# Patient Record
Sex: Male | Born: 1935 | Race: White | Hispanic: No | Smoking: Former smoker
Health system: Southern US, Community
[De-identification: ages and names within clinical notes are randomized; demographics above are authoritative.]

## PROBLEM LIST (undated history)

## (undated) DIAGNOSIS — D649 Anemia, unspecified: Secondary | ICD-10-CM

## (undated) DIAGNOSIS — W19XXXA Unspecified fall, initial encounter: Secondary | ICD-10-CM

## (undated) DIAGNOSIS — R296 Repeated falls: Secondary | ICD-10-CM

## (undated) DIAGNOSIS — N183 Chronic kidney disease, stage 3 unspecified: Secondary | ICD-10-CM

## (undated) DIAGNOSIS — S065X9A Traumatic subdural hemorrhage with loss of consciousness of unspecified duration, initial encounter: Secondary | ICD-10-CM

## (undated) DIAGNOSIS — F0390 Unspecified dementia without behavioral disturbance: Secondary | ICD-10-CM

## (undated) DIAGNOSIS — I10 Essential (primary) hypertension: Secondary | ICD-10-CM

## (undated) DIAGNOSIS — S065XAA Traumatic subdural hemorrhage with loss of consciousness status unknown, initial encounter: Secondary | ICD-10-CM

## (undated) HISTORY — PX: HAND SURGERY: SHX662

## (undated) HISTORY — PX: COLON SURGERY: SHX602

---

## 1998-07-13 ENCOUNTER — Encounter: Payer: Self-pay | Admitting: Orthopedic Surgery

## 1998-07-13 ENCOUNTER — Inpatient Hospital Stay (HOSPITAL_COMMUNITY): Admission: EM | Admit: 1998-07-13 | Discharge: 1998-07-19 | Payer: Self-pay | Admitting: Emergency Medicine

## 1998-07-16 ENCOUNTER — Encounter: Payer: Self-pay | Admitting: Orthopedic Surgery

## 1998-08-04 ENCOUNTER — Ambulatory Visit (HOSPITAL_COMMUNITY): Admission: RE | Admit: 1998-08-04 | Discharge: 1998-08-04 | Payer: Self-pay | Admitting: Orthopedic Surgery

## 1998-08-04 ENCOUNTER — Encounter: Payer: Self-pay | Admitting: Orthopedic Surgery

## 2000-09-28 ENCOUNTER — Other Ambulatory Visit: Admission: RE | Admit: 2000-09-28 | Discharge: 2000-09-28 | Payer: Self-pay | Admitting: Internal Medicine

## 2005-12-27 ENCOUNTER — Ambulatory Visit (HOSPITAL_COMMUNITY): Admission: RE | Admit: 2005-12-27 | Discharge: 2005-12-27 | Payer: Self-pay | Admitting: Pulmonary Disease

## 2006-03-10 ENCOUNTER — Emergency Department (HOSPITAL_COMMUNITY): Admission: EM | Admit: 2006-03-10 | Discharge: 2006-03-10 | Payer: Self-pay | Admitting: Emergency Medicine

## 2006-05-10 ENCOUNTER — Ambulatory Visit: Payer: Self-pay | Admitting: Gastroenterology

## 2006-05-10 ENCOUNTER — Encounter (INDEPENDENT_AMBULATORY_CARE_PROVIDER_SITE_OTHER): Payer: Self-pay | Admitting: *Deleted

## 2006-05-10 ENCOUNTER — Ambulatory Visit (HOSPITAL_COMMUNITY): Admission: RE | Admit: 2006-05-10 | Discharge: 2006-05-10 | Payer: Self-pay | Admitting: Gastroenterology

## 2006-07-14 ENCOUNTER — Encounter (INDEPENDENT_AMBULATORY_CARE_PROVIDER_SITE_OTHER): Payer: Self-pay | Admitting: Surgery

## 2006-07-14 ENCOUNTER — Inpatient Hospital Stay (HOSPITAL_COMMUNITY): Admission: RE | Admit: 2006-07-14 | Discharge: 2006-07-21 | Payer: Self-pay | Admitting: Surgery

## 2007-08-14 ENCOUNTER — Emergency Department (HOSPITAL_COMMUNITY): Admission: EM | Admit: 2007-08-14 | Discharge: 2007-08-14 | Payer: Self-pay | Admitting: Emergency Medicine

## 2009-03-06 ENCOUNTER — Observation Stay (HOSPITAL_COMMUNITY): Admission: EM | Admit: 2009-03-06 | Discharge: 2009-03-09 | Payer: Self-pay | Admitting: Emergency Medicine

## 2009-03-06 ENCOUNTER — Ambulatory Visit: Payer: Self-pay | Admitting: Cardiology

## 2009-03-06 ENCOUNTER — Encounter (INDEPENDENT_AMBULATORY_CARE_PROVIDER_SITE_OTHER): Payer: Self-pay | Admitting: Pulmonary Disease

## 2010-03-12 ENCOUNTER — Ambulatory Visit (HOSPITAL_COMMUNITY)
Admission: RE | Admit: 2010-03-12 | Discharge: 2010-03-12 | Disposition: A | Discharge status: Home / Self Care | Payer: Self-pay | Source: Ambulatory Visit | Attending: Pulmonary Disease | Admitting: Pulmonary Disease

## 2010-03-12 ENCOUNTER — Other Ambulatory Visit (HOSPITAL_COMMUNITY): Payer: Self-pay | Admitting: Pulmonary Disease

## 2010-03-12 DIAGNOSIS — M25559 Pain in unspecified hip: Secondary | ICD-10-CM | POA: Insufficient documentation

## 2010-03-12 DIAGNOSIS — M25551 Pain in right hip: Secondary | ICD-10-CM

## 2010-04-08 LAB — DIFFERENTIAL
Basophils Absolute: 0 10*3/uL (ref 0.0–0.1)
Eosinophils Absolute: 0.1 10*3/uL (ref 0.0–0.7)
Monocytes Absolute: 0.3 10*3/uL (ref 0.1–1.0)
Neutro Abs: 3.8 10*3/uL (ref 1.7–7.7)

## 2010-04-08 LAB — POCT CARDIAC MARKERS
CKMB, poc: <1 ng/mL — ABNORMAL LOW (ref 1.0–8.0)
Myoglobin, poc: 45.7 ng/mL (ref 12–200)

## 2010-04-08 LAB — CBC
Hemoglobin: 15.1 g/dL (ref 13.0–17.0)
RBC: 4.6 MIL/uL (ref 4.22–5.81)
RDW: 13.4 % (ref 11.5–15.5)
WBC: 5.6 10*3/uL (ref 4.0–10.5)

## 2010-04-08 LAB — CARDIAC PANEL(CRET KIN+CKTOT+MB+TROPI)
CK, MB: 2.1 ng/mL (ref 0.3–4.0)
Total CK: 43 U/L (ref 7–232)
Troponin I: 0.01 ng/mL (ref 0.00–0.06)

## 2010-04-08 LAB — BASIC METABOLIC PANEL: BUN: 14 mg/dL (ref 6–23)

## 2010-04-08 LAB — VITAMIN B12: Vitamin B-12: 1189 pg/mL — ABNORMAL HIGH (ref 211–911)

## 2010-04-08 LAB — HOMOCYSTEINE: Homocysteine: 7 umol/L (ref 4.0–15.4)

## 2010-04-08 LAB — TSH: TSH: 1.614 u[IU]/mL (ref 0.350–4.500)

## 2010-06-01 NOTE — Op Note (Signed)
NAMEJAMIR, RONE NO.:  [id number]   MEDICAL RECORD NO.:  [id number]          PATIENT TYPE:  INP   LOCATION:  2899                         FACILITY:  [facility]   PHYSICIAN:  Sandria Bales. Ezzard Standing, M.D.  DATE OF BIRTH:  08/02/1930   DATE OF PROCEDURE:  07/14/2006  DATE OF DISCHARGE:                               OPERATIVE REPORT   PREOPERATIVE DIAGNOSIS:  Right colonic tubovillous adenoma.   POSTOPERATIVE DIAGNOSIS:  Right colonic/cecal tubovillous adenoma.   PROCEDURE:  Laparoscope assisted right hemicolectomy.   SURGEON:  Sandria Bales. Ezzard Standing, M.D.   ASSISTANT:  Angelia Mould. Derrell Lolling, M.D.   ANESTHESIA:  General endotracheal.   ESTIMATED BLOOD LOSS:  100 mL.   DRAINS:  None.   INDICATIONS FOR PROCEDURE:  Mr. Gadson is a 77 year old male who underwent  a screening colonoscopy by Dr. Kassie Mends, was found to have a large  tubovillous adenoma of the cecum with no evidence of malignancy on  biopsy.   Patient now comes for attempted right hemicolectomy.  He completed  antibiotic and mechanical bowel prep at home and now comes to the  operating room.   The indications and potential complications of procedure were explained  to the patient.  The potential complications included but are not  limited to bleeding, infection, leak from the bowel and if it was  cancer, some possible recurrence of the cancer.   OPERATIVE NOTE:  The patient was placed in the supine position, given a  general intravenous anesthetic.  He has 1 gram of cefoxitin initiated at  the procedure, had PS stockings placed, Foley catheter in place.  His  arms were taped by his side.  He had his abdomen shaved, prepped with  Betadine solution, and sterilely draped.   An orogastric tube was placed.  I went through an infraumbilical  incision with sharp dissection, carried down to the abdominal cavity.  A  0 degree 10-mm laparoscope was inserted through a 12-mm Hasson.  I  placed four additional 5-mm  trocars in right upper quadrant, left upper  quadrant, right lower quadrant, left lower quadrant trocars.   Abdominal exploration revealed the right and left lobes of the liver  were unremarkable.  The gallbladder that I could see was unremarkable,  though it had some adhesions to it.  The liver was unremarkable.  I  first started with dissection.  The terminal ileum actually was adhesed  down over the pelvic brim, so I spent about 30 minutes releasing these  adhesions of the terminal ileum up to the cecum.  I then mobilized the  cecum, was able to grab the appendix and marched up the right colon, was  able to reflect this medial and guide into the proper fat plane.   I did not identify ureter, however, I thought I stayed well anterior to  the ureter during the dissection.   I then got to the lesser sac and the transverse colon, went over the  hepatic flexure taking down adhesions from the gallbladder, taking down  the hepatic flexure off the undersurface of the liver.  I was able to  identify the duodenum and sweep the right colon down below the duodenum.   At this time, I thought I had adequate mobilization above the terminal  ileum and the transverse colon and I made an abdominal wall incision  around the umbilicus.  I used the [employer] medical port to protect the  wound.  I was able to eviscerate his right colon.  I divided the  terminal ileum approximately 3 or 4 cm proximal to the ileocecal valve  with a GIA 75 Ethicon stapler.  I divided the right transverse colon  with a GIA 75 Ethicon stapler and then resected the bowel which was  maybe about 15-18 cm in length.  I divided the mesentery using 2-0 silk  ties.   I opened the bowel at the bedside and identified a large villous tumor  in the cecum/right colon.   I divided the bowel using the 75 mm Ethicon GIA stapler.  I then did a  side-to-side stapled anastomosis between the right transverse colon and  the terminal ileum.   Again, this was with a 75 GIA stapler.  I created  about a 6-cm anastomosis.  I closed the enterotomy defect with  interrupted 3-0 silk sutures.  I buttressed the end of the staple line  with 3-0 silk sutures.  I put some in the crotch with 3-0 silk sutures  and oversewed the staple line.   The staple line I examined before closing it.  It looked healthy.  There  was no bleeding.  His prep, however, looked somewhat poor.  I thought it  had essentially no stool contamination.   After completing the closure, I then irrigated the wound.  I tried to  look at closing the mesentery and really thought I would be unable to  close the mesentery completely.  I was afraid about leaving a small hole  so I did not try to close the mesentery.  I just returned the transverse  colon to the right upper quadrant of the abdomen.   I irrigated with 2 liters of saline until it was somewhat clear.  I then  reinsufflated the abdomen and looked through the 5-mm scope, saw no  other evidence of active bleeding.  I closed the midline incision with  two running #1 PDS sutures.  Closed the skin at each site with a staple  gun.  The patient tolerated the procedure well.  Sponge and needle  counts were correct at the end of the case.      Sandria Bales. Ezzard Standing, M.D.  Electronically Signed     DHN/MEDQ  D:  07/14/2006  T:  07/14/2006  Job:  [id number]   cc:   Ramon Dredge L. Juanetta Gosling, M.D.  Kassie Mends, M.D.

## 2010-06-04 NOTE — Discharge Summary (Signed)
NAMESUNIL, [name]                  ACCOUNT NO.:  [id number]   MEDICAL RECORD NO.:  [id number]          PATIENT TYPE:  INP   LOCATION:  5707                         FACILITY:  [facility]   PHYSICIAN:  Sandria Bales. Ezzard Standing, M.D.  DATE OF BIRTH:  09/01/1930   DATE OF ADMISSION:  07/14/2006  DATE OF DISCHARGE:  07/21/2006                               DISCHARGE SUMMARY   DISCHARGE DIAGNOSES:  1. Tubovillous adenoma of right colon, 5 x 4 centimeters, 0-13 lymph      nodes involved.  2. Postoperative delirium/confusion, which is cleared.  3. Mild thrombocytopenia during hospitalization.   OPERATIONS PERFORMED:  The patient underwent a laparoscopic assisted  right hemicolectomy on the 27th of June 2008.   HISTORY OF PRESENT ILLNESS:  [name] is a pleasant, though somewhat  odd, 77 year old white male who is a patient of Dr. Juanetta Gosling of  [place].  He underwent a screening colonoscopy by Dr. Kassie Mends,  and was found to have a large tumor of his cecum.  He was interested in  taking vitamins and colonic cleansing to try to remove his polyp.  I  thought  he would best served by undergoing surgery.  IN my office, he  was accompanied by 3 daughters, who agreed that surgery would be  superior than trying a colonic flush to remove his polyp.   He is actually otherwise in fairly good health.  He does have maybe some  mild early dementia or senility, but he has no significant medical  problems.   He completed an antibiotic and mechanical bowel prep at home.  Came to  the hospital on the day of admission, the 27th of June 2008.  He  underwent a laparoscopically assisted right hemicolectomy, for which he  did very well.   On the first postoperative day, his hemoglobin was 14, hematocrit 41,  white count of 10,000.  His platelets dropped a little bit to 137,000.  They were noted to be 119,000 preoperatively on July 16, 2006.   He did become mildly confused in the hospital about the third or fourth  postoperative day, but this seemed better by the fifth postoperative  day.  He was started on clear liquids, and rapidly advanced to a regular  diet.  By the seventh postoperative day, he was eating a regular diet.  He was afebrile.  He had a bowel movement.  His wounds were looking  good, his staples were removed.  He was sent home on Darvocet-N 100, and  would see me back in 3-4 weeks.   His final pathology, case number [id number], and was read by Dr. Berneta Levins, showed a 5.3 x 4 cm cecal/right colon tubovillous adenoma with no  evidence of malignancy, and 0-13 nodes noted with any abnormality.   CONDITION ON DISCHARGE:  Good.      Sandria Bales. Ezzard Standing, M.D.  Electronically Signed     DHN/MEDQ  D:  08/27/2006  T:  08/27/2006  Job:  [id number]   cc:   Kassie Mends, M.D.  Aliene Beams, MD

## 2010-06-04 NOTE — Op Note (Signed)
Blake Howard, Blake Howard                  ACCOUNT NO.:  [id number]   MEDICAL RECORD NO.:  [id number]          PATIENT TYPE:  AMB   LOCATION:  DAY                           FACILITY:  [facility]   PHYSICIAN:  Kassie Mends, M.D.      DATE OF BIRTH:  [birth year]   DATE OF PROCEDURE:  05/10/2006  DATE OF DISCHARGE:  05/11/2006                               OPERATIVE REPORT   PROCEDURE:  Colonoscopy with cold forceps biopsies.   INDICATION FOR EXAM:  Blake Howard is a 77 year old male who presents for  average risk colon cancer screening.   FINDINGS:  1. Large flat cecal polyp which occupies approximately 2/3rds of the      cecum and extends to the ileocecal border and ascending colon.  It      spares the ileocecal valve.  Multiple biopsies taken via Jumbo      forceps.  2. The patient's prep was poor, especially in his right colon and      polyps less than 1 cm would have been easily missed.  Otherwise no      masses or inflammatory changes seen.  3. Normal retroflexed view of the rectum.   RECOMMENDATIONS:  1. The patient's biopsies were sent as a priority.  I believe he has a      large villous cecal polyp which may contain a small neoplasia.  2. He is asked not to use any aspirin, NSAIDs or anticoagulation until      I let him know.  3. He is given information on polyps.  4. He will likely need at a minimum a cecotomy.  He is asked that his      surgery be performed in [place].  5. Screening colonoscopy in 5 years.   MEDICATIONS:  1. Demerol 25 mg IV.  2. Versed 2 mg IV   PROCEDURE TECHNIQUE:  Physical exam was performed.  Informed consent was  obtained from the patient after explaining benefits, risks and  alternatives to the procedure.  The patient was connected to the monitor  and placed in left lateral position.  Continuous oxygen was provided by  nasal cannula and IV medicine administered through an indwelling  cannula.  After administration of sedation and rectal exam, the  patient's rectum was intubated and the  scope was advanced under direct visualization to the cecum.  The scope  was subsequently moved slowly by carefully examining the color, texture,  anatomy and integrity of the mucosa on the way out.  The patient was  covered in endoscopy and discharged home in satisfactory condition.      Kassie Mends, M.D.  Electronically Signed     SM/MEDQ  D:  05/11/2006  T:  05/11/2006  Job:  [id number]   cc:   Ramon Dredge L. Juanetta Gosling, M.D.  Fax: [phone]

## 2010-11-02 LAB — DIFFERENTIAL
Basophils Relative: 0
Lymphocytes Relative: 4 — ABNORMAL LOW
Monocytes Absolute: 0.6
Neutro Abs: 7.5

## 2010-11-02 LAB — BASIC METABOLIC PANEL
BUN: 13
CO2: 28
Calcium: 8.2 — ABNORMAL LOW
Chloride: 105
Creatinine, Ser: 0.9
GFR calc non Af Amer: >60
Potassium: 4

## 2010-11-02 LAB — CBC
MCHC: 34.6
MCV: 91.7
WBC: 8.5

## 2010-11-03 LAB — PROTIME-INR
INR: 1
Prothrombin Time: 13.2

## 2010-11-03 LAB — DIFFERENTIAL
Basophils Absolute: 0
Basophils Relative: 0
Eosinophils Absolute: 0
Eosinophils Relative: 1
Lymphs Abs: 1
Neutrophils Relative %: 74

## 2010-11-03 LAB — CBC
HCT: 47.5
Hemoglobin: 13.8
Hemoglobin: 16.5
MCHC: 34.6
MCHC: 35
Platelets: 137 — ABNORMAL LOW
Platelets: 162
RBC: 4.34
RBC: 5.21
RDW: 13.1
RDW: 13.2
WBC: 6.4

## 2010-11-03 LAB — COMPREHENSIVE METABOLIC PANEL
ALT: 42
Alkaline Phosphatase: 63
CO2: 29
Calcium: 9.4
Chloride: 105
GFR calc non Af Amer: >60
Glucose, Bld: 99
Sodium: 139
Total Bilirubin: 0.8

## 2010-11-03 LAB — BASIC METABOLIC PANEL
BUN: 11
CO2: 24
Calcium: 8.7
Creatinine, Ser: 1.15
GFR calc non Af Amer: >60
Glucose, Bld: 137 — ABNORMAL HIGH

## 2011-05-11 ENCOUNTER — Emergency Department (HOSPITAL_COMMUNITY): Payer: Medicare HMO

## 2011-05-11 ENCOUNTER — Inpatient Hospital Stay (HOSPITAL_COMMUNITY)
Admission: EM | Admit: 2011-05-11 | Discharge: 2011-05-19 | DRG: 065 | Disposition: A | Discharge status: Home Health | Payer: Medicare HMO | Attending: Internal Medicine | Admitting: Internal Medicine

## 2011-05-11 ENCOUNTER — Encounter (HOSPITAL_COMMUNITY): Payer: Self-pay | Admitting: Emergency Medicine

## 2011-05-11 DIAGNOSIS — Z9181 History of falling: Secondary | ICD-10-CM

## 2011-05-11 DIAGNOSIS — N179 Acute kidney failure, unspecified: Secondary | ICD-10-CM | POA: Diagnosis present

## 2011-05-11 DIAGNOSIS — F0390 Unspecified dementia without behavioral disturbance: Secondary | ICD-10-CM

## 2011-05-11 DIAGNOSIS — I129 Hypertensive chronic kidney disease with stage 1 through stage 4 chronic kidney disease, or unspecified chronic kidney disease: Secondary | ICD-10-CM | POA: Diagnosis present

## 2011-05-11 DIAGNOSIS — I10 Essential (primary) hypertension: Secondary | ICD-10-CM | POA: Diagnosis present

## 2011-05-11 DIAGNOSIS — M549 Dorsalgia, unspecified: Secondary | ICD-10-CM

## 2011-05-11 DIAGNOSIS — M545 Low back pain, unspecified: Secondary | ICD-10-CM | POA: Diagnosis present

## 2011-05-11 DIAGNOSIS — Y92009 Unspecified place in unspecified non-institutional (private) residence as the place of occurrence of the external cause: Secondary | ICD-10-CM

## 2011-05-11 DIAGNOSIS — S065X9A Traumatic subdural hemorrhage with loss of consciousness of unspecified duration, initial encounter: Secondary | ICD-10-CM | POA: Diagnosis present

## 2011-05-11 DIAGNOSIS — E860 Dehydration: Secondary | ICD-10-CM | POA: Diagnosis present

## 2011-05-11 DIAGNOSIS — G3183 Dementia with Lewy bodies: Secondary | ICD-10-CM | POA: Diagnosis present

## 2011-05-11 DIAGNOSIS — W19XXXA Unspecified fall, initial encounter: Secondary | ICD-10-CM | POA: Diagnosis present

## 2011-05-11 DIAGNOSIS — F0280 Dementia in other diseases classified elsewhere without behavioral disturbance: Secondary | ICD-10-CM | POA: Diagnosis present

## 2011-05-11 DIAGNOSIS — R296 Repeated falls: Secondary | ICD-10-CM

## 2011-05-11 DIAGNOSIS — I6200 Nontraumatic subdural hemorrhage, unspecified: Principal | ICD-10-CM | POA: Diagnosis present

## 2011-05-11 DIAGNOSIS — N183 Chronic kidney disease, stage 3 unspecified: Secondary | ICD-10-CM | POA: Diagnosis present

## 2011-05-11 HISTORY — DX: Traumatic subdural hemorrhage with loss of consciousness of unspecified duration, initial encounter: S06.5X9A

## 2011-05-11 HISTORY — DX: Chronic kidney disease, stage 3 (moderate): N18.3

## 2011-05-11 HISTORY — DX: Traumatic subdural hemorrhage with loss of consciousness status unknown, initial encounter: S06.5XAA

## 2011-05-11 HISTORY — DX: Unspecified dementia, unspecified severity, without behavioral disturbance, psychotic disturbance, mood disturbance, and anxiety: F03.90

## 2011-05-11 HISTORY — DX: Anemia, unspecified: D64.9

## 2011-05-11 HISTORY — DX: Repeated falls: R29.6

## 2011-05-11 HISTORY — DX: Unspecified fall, initial encounter: W19.XXXA

## 2011-05-11 HISTORY — DX: Chronic kidney disease, stage 3 unspecified: N18.30

## 2011-05-11 HISTORY — DX: Essential (primary) hypertension: I10

## 2011-05-11 LAB — BASIC METABOLIC PANEL
BUN: 62 mg/dL — ABNORMAL HIGH (ref 6–23)
CO2: 21 mEq/L (ref 19–32)
Glucose, Bld: 95 mg/dL (ref 70–99)
Potassium: 4.9 mEq/L (ref 3.5–5.1)
Sodium: 140 mEq/L (ref 135–145)

## 2011-05-11 LAB — POCT I-STAT, CHEM 8
Chloride: 115 mEq/L — ABNORMAL HIGH (ref 96–112)
HCT: 35 % — ABNORMAL LOW (ref 39.0–52.0)
Potassium: 4.6 mEq/L (ref 3.5–5.1)

## 2011-05-11 LAB — TROPONIN I: Troponin I: <0.3 ng/mL (ref <0.30)

## 2011-05-11 LAB — DIFFERENTIAL
Eosinophils Absolute: 0.2 10*3/uL (ref 0.0–0.7)
Eosinophils Relative: 2 % (ref 0–5)
Lymphocytes Relative: 13 % (ref 12–46)
Lymphs Abs: 1.2 10*3/uL (ref 0.7–4.0)
Monocytes Relative: 7 % (ref 3–12)
Neutrophils Relative %: 78 % — ABNORMAL HIGH (ref 43–77)

## 2011-05-11 LAB — PROTIME-INR: INR: 1.14 (ref 0.00–1.49)

## 2011-05-11 LAB — CBC
Hemoglobin: 11.7 g/dL — ABNORMAL LOW (ref 13.0–17.0)
MCH: 30.8 pg (ref 26.0–34.0)
MCV: 91.6 fL (ref 78.0–100.0)
Platelets: 156 10*3/uL (ref 150–400)
RBC: 3.8 MIL/uL — ABNORMAL LOW (ref 4.22–5.81)
WBC: 8.9 10*3/uL (ref 4.0–10.5)

## 2011-05-11 MED ORDER — SODIUM CHLORIDE 0.9 % IV SOLN: 250.0000 mL | INTRAVENOUS | Status: DC | PRN

## 2011-05-11 MED ORDER — SODIUM CHLORIDE 0.9 % IV BOLUS (SEPSIS)
1000.0000 mL | Freq: Once | INTRAVENOUS | Status: AC
  Administered 2011-05-11: 1000 mL via INTRAVENOUS

## 2011-05-11 MED ORDER — SODIUM CHLORIDE 0.9 % IJ SOLN
3.0000 mL | Freq: Two times a day (BID) | INTRAMUSCULAR | Status: DC
  Administered 2011-05-12 – 2011-05-19 (×9): 3 mL via INTRAVENOUS
  Filled 2011-05-11 (×9): qty 3

## 2011-05-11 MED ORDER — LISINOPRIL 10 MG PO TABS: 10.0000 mg | ORAL_TABLET | Freq: Every day | ORAL | Status: DC

## 2011-05-11 MED ORDER — LORAZEPAM 2 MG/ML IJ SOLN
0.5000 mg | Freq: Once | INTRAMUSCULAR | Status: AC
  Administered 2011-05-12: 0.5 mg via INTRAVENOUS
  Filled 2011-05-11: qty 1

## 2011-05-11 MED ORDER — SODIUM CHLORIDE 0.9 % IJ SOLN: 3.0000 mL | Freq: Two times a day (BID) | INTRAMUSCULAR | Status: DC

## 2011-05-11 MED ORDER — SODIUM CHLORIDE 0.9 % IJ SOLN: 3.0000 mL | INTRAMUSCULAR | Status: DC | PRN

## 2011-05-11 MED ORDER — SODIUM CHLORIDE 0.9 % IV SOLN: Freq: Once | INTRAVENOUS | Status: DC

## 2011-05-11 NOTE — ED Notes (Signed)
Very difficult to asses patient.  Will respond verbally to this nurse at intervals.  Eyes open, pupils equal and reactive.  Follows simple commands such as to "move arm".  This is baseline for the patient, daughter states some days he will talk, others he does not.  Has always been very quiet

## 2011-05-11 NOTE — ED Provider Notes (Signed)
History   This chart was scribed for Blake Octave, MD by Melba Coon. The patient was seen in room APA02/APA02 and the patient's care was started at 6:07PM.    CSN: [id number]  Arrival date & time 05/11/11  1745   First MD Initiated Contact with Patient 05/11/11 1803      Chief Complaint  Patient presents with  . Fall  . Back Pain    (Consider location/radiation/quality/duration/timing/severity/associated sxs/prior treatment) HPI A Level 5 Caveat applies due to the condition of the patient. (sundowning dementia) Blake Howard is a 78 y.o. male whose family presents him to the Emergency Department complaining of constant, moderate to severe lower back pain with an onset earlier today pertaining to a non-witnessed fall, no head contact, no LOC. Pt fell down the stairs on his lower back, right hip and right elbow. Slower ambulation with slight difficulty compared to baseline. No fever, neck pain, n/v/d, or extremity edema, weakness, numbness, or tingling. Pt takes HTN meds. No recent changes in medications. No blood thinners taken. No known allergies. No other pertinent medical symptoms.  PCP: Dr. Shelva Majestic   Past Medical History  Diagnosis Date  . Dementia   . Hypertension     Past Surgical History  Procedure Date  . Hand surgery   . Colon surgery     No family history on file.  History  Substance Use Topics  . Smoking status: Never Smoker   . Smokeless tobacco: Not on file  . Alcohol Use: No      Review of Systems 10 Systems reviewed and all are negative for acute change except as noted in the HPI.   Allergies  Review of patient's allergies indicates no known allergies.  Home Medications  No current outpatient prescriptions on file.  BP 130/50  Pulse 76  Temp(Src) 97.9 F (36.6 C) (Oral)  Resp 18  Ht 5\' 4"  (1.626 m)  Wt 155 lb (70.308 kg)  BMI 26.61 kg/m2  SpO2 98%  Physical Exam  Nursing note and vitals reviewed. Constitutional: He is  oriented to person, place, and time. He appears well-developed and well-nourished.       Awake, alert, nontoxic appearance with baseline speech for patient.  HENT:  Head: Normocephalic and atraumatic.  Mouth/Throat: No oropharyngeal exudate.  Eyes: EOM are normal. Pupils are equal, round, and reactive to light. Right eye exhibits no discharge. Left eye exhibits no discharge.  Neck: Normal range of motion. Neck supple.  Cardiovascular: Normal rate and regular rhythm.   No murmur heard.      Normal distal pulses in lower extremities. 2+ DP and PT pulses.  Pulmonary/Chest: Effort normal and breath sounds normal. No stridor. No respiratory distress. He has no wheezes. He has no rales. He exhibits no tenderness.  Abdominal: Soft. Bowel sounds are normal. He exhibits no mass. There is no tenderness. There is no rebound.  Musculoskeletal: He exhibits tenderness (midline at sacrum and coccyx).       Baseline ROM, moves extremities with no obvious new focal weakness. No step-off or tenderness to C-spine. Normal distal pulses in lower extremities. Plantar and dorsi flexion of feet intact bilaterally. Able to flex great toes bilaterally.  Lymphadenopathy:    He has no cervical adenopathy.  Neurological: He is alert and oriented to person, place, and time.       Awake, alert, cooperative and aware of situation; motor strength bilaterally; sensation normal to light touch bilaterally; peripheral visual fields full to confrontation; no facial asymmetry;  tongue midline; major cranial nerves appear intact; normal finger to nose bilaterally. 2+ patellar reflexes. Nml rectal tone.  Skin: Skin is warm. No rash noted.  Psychiatric: He has a normal mood and affect.    ED Course  Procedures (including critical care time)   DIAGNOSTIC STUDIES: Oxygen Saturation is 98% on room air, normal by my interpretation.    COORDINATION OF CARE:  6:12PM - EDMD will order CXR, right elbow XR, pelvic CT, and head CT for  the pt. 7:41PM - EDMD reviewed imaging; back is OK; head CT results show old subdural hematoma that did not happen today. EDMD consults with neurosurgeon at [facility]; doesn't think surgery is needed and recommends admittance and overnight watch. Neuro exam performed again.  Labs Reviewed - No data to display Dg Chest 2 View  05/11/2011  *RADIOLOGY REPORT*  Clinical Data: 78 year old male status post fall.  Pain.  CHEST - 2 VIEW  Comparison: 03/06/2009.  Findings: Semi upright AP and lateral views of the chest.  Low lung volumes.  Cardiac size and mediastinal contours are within normal limits.  Visualized tracheal air column is within normal limits. No pneumothorax, pulmonary edema, pleural effusion or confluent pulmonary opacity. No acute osseous abnormality identified.  IMPRESSION: Low lung volumes, otherwise no acute cardiopulmonary abnormality.  Original Report Authenticated By: Harley Hallmark, M.D.   Dg Elbow Complete Right  05/11/2011  *RADIOLOGY REPORT*  Clinical Data: 78 year old male status post fall.  Pain.  RIGHT ELBOW - COMPLETE 3+ VIEW  Comparison: None.  Findings: Bulky posterior osteophyte off of the olecranon appears grossly intact.  No definite joint effusion.  No radial head fracture identified.  No distal humerus or proximal ulna fracture identified.  Multiple punctate hyperdensities over the biceps and forearm area may reflect screen artifact or small foreign bodies.  IMPRESSION: No definite acute fracture or dislocation of the right elbow. Screen artifact versus small radiodense foreign bodies, clinical correlation recommended.  Original Report Authenticated By: Harley Hallmark, M.D.   Ct Head Wo Contrast  05/11/2011  *RADIOLOGY REPORT*  Clinical Data: Fall  CT HEAD WITHOUT CONTRAST  Technique:  Contiguous axial images were obtained from the base of the skull through the vertex without contrast.  Comparison: MRI 03/06/2009, head CT 03/06/2009  Findings: Right basal ganglial lacunar  infarct again noted. Cortical volume loss noted with proportional ventricular prominence.  Periventricular white matter hypodensity likely indicates small vessel ischemic change.  There is a mixed density lenticular left extra-axial fluid collection.  No overlying skull fracture.  No midline shift.  Left and paranasal sinuses are intact.  No acute infarct or mass lesion identified.  IMPRESSION: Mixed density left subdural hematoma most compatible with mixed acute versus subacute/chronic evolution.  Critical Value/emergent results were called by telephone at the time of interpretation on 05/11/2011  at 7:20 p.m.  to  Dr. Manus Gunning, who verbally acknowledged these results.  Original Report Authenticated By: Harrel Lemon, M.D.   Ct Pelvis Wo Contrast  05/11/2011  *RADIOLOGY REPORT*  Clinical Data:  Fall.  Back pain.  Right hip pain.  CT PELVIS WITHOUT CONTRAST  Technique:  Multidetector CT imaging of the pelvis was performed following the standard protocol without intravenous contrast.  Comparison:  Two-view abdomen 07/19/2006.  Findings:  The hips are located bilaterally.  A remote right superior and inferior pubic ramus fracture is healed.  No acute fracture is evident.  The sacrum and coccyx are intact.  Degenerative changes are noted in the SI joints  bilaterally.  There is loss of disc height and endplate degenerative change at L4-5 and L5-S1.  Foraminal narrowing is worse on the left at L4-5.  Mild foraminal narrowing is present bilaterally at L5-S1.  Atherosclerotic calcifications are noted in the aorta and branch vessels.  There is no aneurysm.  The visualized bowel is unremarkable.  Urinary bladder is within normal limits.  No significant adenopathy or free fluid is evident.  IMPRESSION:  1.  Degenerative changes of the lower lumbar spine and SI joints bilaterally. 2.  No acute fracture. 3.  Remote fractures of the right superior and inferior pubic ramus are healed. 4.  Atherosclerosis.  Original Report  Authenticated By: Jamesetta Orleans. MATTERN, M.D.     No diagnosis found.    MDM  Mechanical fall onto buttock now with sacral and coccyx pain. No neurological deficits. Normal rectal tone. Dementia at baseline. Normal mental status per family.  Called by radiology regarding left-sided subdural hematoma. Appears subacute.  D/w Neurosurgery Dr. Alphonzo Lemmings who reviewed images.  Agrees is not acute and does not require surgery.  Patient does not need to be transferred.   States patient can be discharged home if he has supervision.  Neuro exam remains normal.  D/w family.  No pelvic fractures.  Patient lives alone but has someone who can check on him.  I would prefer observation admission to the hospital with repeat CT in morning.  Family in agreement.  I personally performed the services described in this documentation, which was scribed in my presence.  The recorded information has been reviewed and considered.  CRITICAL CARE Performed by: Blake Howard   Total critical care time: 30  Critical care time was exclusive of separately billable procedures and treating other patients.  Critical care was necessary to treat or prevent imminent or life-threatening deterioration.  Critical care was time spent personally by me on the following activities: development of treatment plan with patient and/or surrogate as well as nursing, discussions with consultants, evaluation of patient's response to treatment, examination of patient, obtaining history from patient or surrogate, ordering and performing treatments and interventions, ordering and review of laboratory studies, ordering and review of radiographic studies, pulse oximetry and re-evaluation of patient's condition.   Date: 05/11/2011  Rate: 70  Rhythm: normal sinus rhythm  QRS Axis: normal  Intervals: normal  ST/T Wave abnormalities: normal  Conduction Disutrbances:none  Narrative Interpretation:   Old EKG Reviewed:  unchanged       Blake Octave, MD 05/11/11 2046

## 2011-05-11 NOTE — ED Notes (Signed)
Pt fell earlier and family states that he has complained of lower back pain.  Pt has "sundowning dementia" and does not answer my questions.

## 2011-05-11 NOTE — H&P (Signed)
Chief Complaint:  Fall  HPI: Pleasant 78 year old male who has a history for dementia for the last 2-3 years he does live alone but has a son that is next door and multiple family members who check up on him daily comes in because of frequent falls. He had a CT of his head which shows a small subdural hematoma. He has no focal neurological deficits. 2 of his daughters are with him right now and they say he is at his baseline. He does have dementia which is at the point of some days he does not recognize his family members but most days he does. He can still ambulate and feed himself and take care of himself at home. He does have a med alert system at home that he wears on his wrist. He has had at least 2 falls that they know of in the last month. Otherwise has been in his normal state of health. We are observing him overnight because of the subdural hematoma. Neurosurgery was called at home and recommended actually that he could go home if there was someone that could stay with him which he lives alone and recommended a repeat CAT scan in the next day or so. Patient denies any pain and has no complaints at this time.  Review of Systems:  Otherwise negative  Past Medical History: Past Medical History  Diagnosis Date  . Dementia   . Hypertension    Past Surgical History  Procedure Date  . Hand surgery   . Colon surgery     Medications: Prior to Admission medications   Medication Sig Start Date End Date Taking? Authorizing Provider  fish oil-omega-3 fatty acids 1000 MG capsule Take 1 g by mouth daily.   Yes Historical Provider, MD  Homeopathic Products (CALENDULA) CREA Apply 1 application topically daily as needed. For skin irritation   Yes Historical Provider, MD  lisinopril (PRINIVIL,ZESTRIL) 10 MG tablet Take 10 mg by mouth daily.   Yes Historical Provider, MD  Multiple Vitamin (MULITIVITAMIN WITH MINERALS) TABS Take 1 tablet by mouth daily.   Yes Historical Provider, MD  rivastigmine  (EXELON) 4.6 mg/24hr Place 1 patch onto the skin daily.   Yes Historical Provider, MD    Allergies:  No Known Allergies  Social History:  reports that he has never smoked. He does not have any smokeless tobacco history on file. He reports that he does not drink alcohol or use illicit drugs.   Physical Exam: Filed Vitals:   05/11/11 1751 05/11/11 2115 05/11/11 2214  BP: 130/50  150/76  Pulse: 76 70 71  Temp: 97.9 F (36.6 C) 97.9 F (36.6 C) 97.5 F (36.4 C)  TempSrc: Oral Axillary Axillary  Resp: 18 17 18   Height: 5\' 4"  (1.626 m)  5\' 4"  (1.626 m)  Weight: 70.308 kg (155 lb)  64.7 kg (142 lb 10.2 oz)  SpO2: 98% 97% 98%   BP 150/76  Pulse 71  Temp(Src) 97.5 F (36.4 C) (Axillary)  Resp 18  Ht 5\' 4"  (1.626 m)  Wt 64.7 kg (142 lb 10.2 oz)  BMI 24.48 kg/m2  SpO2 98% General appearance: alert, cooperative and no distress Neck: no adenopathy, no carotid bruit, no JVD and supple, symmetrical, trachea midline Lungs: clear to auscultation bilaterally Heart: regular rate and rhythm, S1, S2 normal, no murmur, click, rub or gallop Abdomen: soft, non-tender; bowel sounds normal; no masses,  no organomegaly Extremities: extremities normal, atraumatic, no cyanosis or edema Pulses: 2+ and symmetric Skin: Skin color, texture,  turgor normal. No rashes or lesions Neurologic: Grossly normal    Labs on Admission:   [facility] 05/11/11 2004 05/11/11 1842  NA 140 142  K 4.9 4.6  CL 105 115*  CO2 21 --  GLUCOSE 95 103*  BUN 62* 62*  CREATININE 2.24* 2.40*  CALCIUM 9.8 --  MG -- --  PHOS -- --    Basename 05/11/11 2004 05/11/11 1842  WBC 8.9 --  NEUTROABS 6.9 --  HGB 11.7* 11.9*  HCT 34.8* 35.0*  MCV 91.6 --  PLT 156 --    Basename 05/11/11 2004  CKTOTAL --  CKMB --  CKMBINDEX --  TROPONINI <0.30    Radiological Exams on Admission: Dg Chest 2 View  05/11/2011  *RADIOLOGY REPORT*  Clinical Data: 78 year old male status post fall.  Pain.  CHEST - 2 VIEW  Comparison:  03/06/2009.  Findings: Semi upright AP and lateral views of the chest.  Low lung volumes.  Cardiac size and mediastinal contours are within normal limits.  Visualized tracheal air column is within normal limits. No pneumothorax, pulmonary edema, pleural effusion or confluent pulmonary opacity. No acute osseous abnormality identified.  IMPRESSION: Low lung volumes, otherwise no acute cardiopulmonary abnormality.  Original Report Authenticated By: Harley Hallmark, M.D.   Dg Elbow Complete Right  05/11/2011  *RADIOLOGY REPORT*  Clinical Data: 78 year old male status post fall.  Pain.  RIGHT ELBOW - COMPLETE 3+ VIEW  Comparison: None.  Findings: Bulky posterior osteophyte off of the olecranon appears grossly intact.  No definite joint effusion.  No radial head fracture identified.  No distal humerus or proximal ulna fracture identified.  Multiple punctate hyperdensities over the biceps and forearm area may reflect screen artifact or small foreign bodies.  IMPRESSION: No definite acute fracture or dislocation of the right elbow. Screen artifact versus small radiodense foreign bodies, clinical correlation recommended.  Original Report Authenticated By: Harley Hallmark, M.D.   Ct Head Wo Contrast  05/11/2011  *RADIOLOGY REPORT*  Clinical Data: Fall  CT HEAD WITHOUT CONTRAST  Technique:  Contiguous axial images were obtained from the base of the skull through the vertex without contrast.  Comparison: MRI 03/06/2009, head CT 03/06/2009  Findings: Right basal ganglial lacunar infarct again noted. Cortical volume loss noted with proportional ventricular prominence.  Periventricular white matter hypodensity likely indicates small vessel ischemic change.  There is a mixed density lenticular left extra-axial fluid collection.  No overlying skull fracture.  No midline shift.  Left and paranasal sinuses are intact.  No acute infarct or mass lesion identified.  IMPRESSION: Mixed density left subdural hematoma most compatible  with mixed acute versus subacute/chronic evolution.  Critical Value/emergent results were called by telephone at the time of interpretation on 05/11/2011  at 7:20 p.m.  to  Dr. Manus Gunning, who verbally acknowledged these results.  Original Report Authenticated By: Harrel Lemon, M.D.   Ct Pelvis Wo Contrast  05/11/2011  *RADIOLOGY REPORT*  Clinical Data:  Fall.  Back pain.  Right hip pain.  CT PELVIS WITHOUT CONTRAST  Technique:  Multidetector CT imaging of the pelvis was performed following the standard protocol without intravenous contrast.  Comparison:  Two-view abdomen 07/19/2006.  Findings:  The hips are located bilaterally.  A remote right superior and inferior pubic ramus fracture is healed.  No acute fracture is evident.  The sacrum and coccyx are intact.  Degenerative changes are noted in the SI joints bilaterally.  There is loss of disc height and endplate degenerative change at L4-5 and L5-S1.  Foraminal narrowing is worse on the left at L4-5.  Mild foraminal narrowing is present bilaterally at L5-S1.  Atherosclerotic calcifications are noted in the aorta and branch vessels.  There is no aneurysm.  The visualized bowel is unremarkable.  Urinary bladder is within normal limits.  No significant adenopathy or free fluid is evident.  IMPRESSION:  1.  Degenerative changes of the lower lumbar spine and SI joints bilaterally. 2.  No acute fracture. 3.  Remote fractures of the right superior and inferior pubic ramus are healed. 4.  Atherosclerosis.  Original Report Authenticated By: Jamesetta Orleans. MATTERN, M.D.    Assessment/Plan Present on Admission:  78 year old male with mechanical fall resulted small subdural hematoma with no focal neurological deficits and has dementia and lives alone  .Subdural hematoma .Dementia .Hypertension .Back pain, lumbosacral  We'll observe him overnight obtain frequent neurological checks and repeat CAT scan of his head tomorrow. He's also been complaining of some  lumbar thoracic back back pain per family over the last several weeks on and obtain L-spine and T-spine x-ray series to make sure he doesn't have underlying compression fracture. I had a thorough discussion with his 2 daughters that are here concerning his living situation and safety concerns. He has a very strong family support system and multiple family members check on he'll throughout the day. However are not sure if it's safe for him to live long at this point. They do have home health already set up a nurse was supposed to come out tomorrow to assess his home situation. The family would obviously like to keep him at home as well as possible I think it would be very difficult to move him as he is very adamant about not moving. Further recommendation pending her overall course and repeat CAT scan tomorrow.    Torez Beauregard A [phone] 05/11/2011, 11:03 PM

## 2011-05-11 NOTE — ED Notes (Addendum)
Monitor NSR with no ectopics observed.  Patient chewing on straw - family states this is his norm to chew on straws.  Patient does not communicate verbally to this nurse.    Skin warm and dry  Color good

## 2011-05-12 ENCOUNTER — Observation Stay (HOSPITAL_COMMUNITY): Payer: Medicare HMO

## 2011-05-12 LAB — BASIC METABOLIC PANEL
BUN: 44 mg/dL — ABNORMAL HIGH (ref 6–23)
CO2: 24 mEq/L (ref 19–32)
CO2: 24 mEq/L (ref 19–32)
Chloride: 110 mEq/L (ref 96–112)
GFR calc non Af Amer: 36 mL/min — ABNORMAL LOW (ref >90)
Glucose, Bld: 134 mg/dL — ABNORMAL HIGH (ref 70–99)
Potassium: 4.3 mEq/L (ref 3.5–5.1)
Sodium: 143 mEq/L (ref 135–145)
Sodium: 140 mEq/L (ref 135–145)

## 2011-05-12 LAB — CBC
HCT: 33.1 % — ABNORMAL LOW (ref 39.0–52.0)
MCV: 91.7 fL (ref 78.0–100.0)
Platelets: 140 10*3/uL — ABNORMAL LOW (ref 150–400)
RBC: 3.61 MIL/uL — ABNORMAL LOW (ref 4.22–5.81)
WBC: 6.9 10*3/uL (ref 4.0–10.5)

## 2011-05-12 MED ORDER — SODIUM CHLORIDE 0.9 % IV BOLUS (SEPSIS)
1000.0000 mL | Freq: Once | INTRAVENOUS | Status: AC
  Administered 2011-05-12: 1000 mL via INTRAVENOUS

## 2011-05-12 MED ORDER — SODIUM CHLORIDE 0.9 % IV BOLUS (SEPSIS): 1000.0000 mL | Freq: Once | INTRAVENOUS | Status: DC

## 2011-05-12 NOTE — Progress Notes (Signed)
Subjective: This man is wheezing somewhat better but he was clinically and biochemically dehydrated this morning. I've given him IV fluids which hopefully has improved his creatinine. His repeat CT brain scan is unchanged with no progression of the subdural bleed. Unfortunately, he has not been very stable with mobility and physical therapy is somewhat concerned about this. The family overall her keen to take him home but they would like him to be a little bit stronger.           Physical Exam: Blood pressure 145/81, pulse 69, temperature 98 F (36.7 C), temperature source Oral, resp. rate 18, height 5\' 4"  (1.626 m), weight 64.7 kg (142 lb 10.2 oz), SpO2 98.00%. He looks systemically well. Heart sounds are present and normal. Lung fields are clear. He is alert and orientated to place and person. There are no obvious focal neurological signs.   Investigations:     Basic Metabolic Panel:  Basename 05/12/11 1349 05/12/11 0528  NA 140 143  K 4.3 4.2  CL 108 110  CO2 24 24  GLUCOSE 134* 100*  BUN 44* 51*  CREATININE 1.72* 1.94*  CALCIUM 8.8 9.2  MG -- --  PHOS -- --       CBC:  Basename 05/12/11 0528 05/11/11 2004  WBC 6.9 8.9  NEUTROABS -- 6.9  HGB 11.2* 11.7*  HCT 33.1* 34.8*  MCV 91.7 91.6  PLT 140* 156    Dg Chest 2 View  05/11/2011  *RADIOLOGY REPORT*  Clinical Data: 78 year old male status post fall.  Pain.  CHEST - 2 VIEW  Comparison: 03/06/2009.  Findings: Semi upright AP and lateral views of the chest.  Low lung volumes.  Cardiac size and mediastinal contours are within normal limits.  Visualized tracheal air column is within normal limits. No pneumothorax, pulmonary edema, pleural effusion or confluent pulmonary opacity. No acute osseous abnormality identified.  IMPRESSION: Low lung volumes, otherwise no acute cardiopulmonary abnormality.  Original Report Authenticated By: Harley Hallmark, M.D.   Dg Thoracic Spine 2 View  05/12/2011  *RADIOLOGY REPORT*   Clinical Data: 78 year old male with pain.  THORACIC SPINE - 2 VIEW  Comparison: Chest radiographs 05/11/2011 and earlier.  Findings: Normal thoracic segmentation. Cervicothoracic junction alignment is within normal limits.  Thoracic vertebral bodies appears stable and intact.  Relatively preserved disc spaces. Grossly stable visualized thoracic visceral contours.  Calcified atherosclerosis of the aorta.  IMPRESSION: No acute fracture or listhesis identified in the thoracic spine.  Original Report Authenticated By: Harley Hallmark, M.D.   Dg Lumbar Spine 2-3 Views  05/12/2011  *RADIOLOGY REPORT*  Clinical Data: 78 year old male with back pain.  LUMBAR SPINE - 2-3 VIEW  Comparison: Abdominal radiographs 07/19/2006 and earlier.  Findings: Increased levoconvex lumbar scoliosis with rotatory component and 2008.  Normal lumbar segmentation.  Calcified atherosclerosis of the aorta and iliac arteries.  Chronic endplate spurring at L4-L5 may result in functional ankylosis.  Mild to moderate lumbar disc space narrowing elsewhere with lesser endplate spurring.  No lumbar or lower thoracic compression fracture.  No spondylolisthesis.  IMPRESSION: Progression of levoconvex scoliosis with rotatory component and 2008. No acute osseous abnormality in the lumbar spine.  Original Report Authenticated By: Harley Hallmark, M.D.   Dg Elbow Complete Right  05/11/2011  *RADIOLOGY REPORT*  Clinical Data: 78 year old male status post fall.  Pain.  RIGHT ELBOW - COMPLETE 3+ VIEW  Comparison: None.  Findings: Bulky posterior osteophyte off of the olecranon appears grossly intact.  No definite joint effusion.  No radial head fracture identified.  No distal humerus or proximal ulna fracture identified.  Multiple punctate hyperdensities over the biceps and forearm area may reflect screen artifact or small foreign bodies.  IMPRESSION: No definite acute fracture or dislocation of the right elbow. Screen artifact versus small radiodense  foreign bodies, clinical correlation recommended.  Original Report Authenticated By: Harley Hallmark, M.D.   Ct Head Wo Contrast  05/12/2011  *RADIOLOGY REPORT*  Clinical Data: Followup subdural hematoma.  CT HEAD WITHOUT CONTRAST  Technique:  Contiguous axial images were obtained from the base of the skull through the vertex without contrast.  Comparison: 05/11/2011 and 03/07/2009 CT.  03/06/2009 MR.  Findings: Complex left convexity extra-axial collection appears similar to the recent CT.  This may represent a mixed acute and subacute subdural hematoma with septations causing the lenticular appearance. Epidural component not entirely excluded.  In the proper clinical setting, empyema would also need to be considered however there is no surrounding vasogenic edema and there is a history of recent falls.  Atrophy.  Ventricular prominence may be related to atrophy however, mild hydrocephalus not excluded. Appearance without significant change.  Small vessel disease type changes.  Remote infarct left caudate. No CT evidence of large acute infarct.  No intracranial mass lesion detected on this unenhanced exam.  Lobular prominence of the posterior-superior nasopharynx left paracentral position.  This may represent a Thornwaldt cyst. Direct visualization would be necessary to exclude primary mucosal abnormality.  IMPRESSION: No significant change.  Please see above.  Original Report Authenticated By: Fuller Canada, M.D.   Ct Head Wo Contrast  05/11/2011  *RADIOLOGY REPORT*  Clinical Data: Fall  CT HEAD WITHOUT CONTRAST  Technique:  Contiguous axial images were obtained from the base of the skull through the vertex without contrast.  Comparison: MRI 03/06/2009, head CT 03/06/2009  Findings: Right basal ganglial lacunar infarct again noted. Cortical volume loss noted with proportional ventricular prominence.  Periventricular white matter hypodensity likely indicates small vessel ischemic change.  There is a mixed  density lenticular left extra-axial fluid collection.  No overlying skull fracture.  No midline shift.  Left and paranasal sinuses are intact.  No acute infarct or mass lesion identified.  IMPRESSION: Mixed density left subdural hematoma most compatible with mixed acute versus subacute/chronic evolution.  Critical Value/emergent results were called by telephone at the time of interpretation on 05/11/2011  at 7:20 p.m.  to  Dr. Manus Gunning, who verbally acknowledged these results.  Original Report Authenticated By: Harrel Lemon, M.D.   Ct Pelvis Wo Contrast  05/11/2011  *RADIOLOGY REPORT*  Clinical Data:  Fall.  Back pain.  Right hip pain.  CT PELVIS WITHOUT CONTRAST  Technique:  Multidetector CT imaging of the pelvis was performed following the standard protocol without intravenous contrast.  Comparison:  Two-view abdomen 07/19/2006.  Findings:  The hips are located bilaterally.  A remote right superior and inferior pubic ramus fracture is healed.  No acute fracture is evident.  The sacrum and coccyx are intact.  Degenerative changes are noted in the SI joints bilaterally.  There is loss of disc height and endplate degenerative change at L4-5 and L5-S1.  Foraminal narrowing is worse on the left at L4-5.  Mild foraminal narrowing is present bilaterally at L5-S1.  Atherosclerotic calcifications are noted in the aorta and branch vessels.  There is no aneurysm.  The visualized bowel is unremarkable.  Urinary bladder is within normal limits.  No significant adenopathy or free fluid is evident.  IMPRESSION:  1.  Degenerative changes of the lower lumbar spine and SI joints bilaterally. 2.  No acute fracture. 3.  Remote fractures of the right superior and inferior pubic ramus are healed. 4.  Atherosclerosis.  Original Report Authenticated By: Jamesetta Orleans. MATTERN, M.D.      Medications:  Scheduled:   . LORazepam  0.5 mg Intravenous Once  . sodium chloride  1,000 mL Intravenous Once  . sodium chloride  1,000  mL Intravenous Once  . sodium chloride  3 mL Intravenous Q12H  . DISCONTD: sodium chloride   Intravenous Once  . DISCONTD: lisinopril  10 mg Oral Daily  . DISCONTD: sodium chloride  1,000 mL Intravenous Once  . DISCONTD: sodium chloride  3 mL Intravenous Q12H    Impression: 1. Subdural hematoma, stable. 2. Frequent falls. 3. Hypertension. 4. Dementia. 5. Lumbosacral back pain.     Plan: 1. Continue with oral hydration. 2. Try to mobilize and reevaluate tomorrow to see if he is able to go home.     LOS: 1 day   Wilson Singer Pager [phone]  05/12/2011, 2:42 PM

## 2011-05-12 NOTE — Evaluation (Signed)
Physical Therapy Evaluation Patient Details Name: Blake Howard MRN: [id number] DOB: [birth year] Today's Date: 05/12/2011 Time: 1478-2956 PT Time Calculation (min): 48 min  PT Assessment / Plan / Recommendation Clinical Impression  Pt is seen for eval, alert and coopertive but with a flat affect and extremely slow to react to or answer questions.  He has apparently fallen down some steps at home and now has had a dramatic decline in his cognition and mobility.  There is some rigidity noted throughout with increased extensor tones seen during gait.  All patterns of motions are compromised  and all mobility needs min to mod assist.  He now requires a walker for gait.  I am recommending SNF for pt st d/c.  Family would like to talk to the other siblings to decide what to do.  If he returns home, he will need full time care and HHPT.        PT Assessment  Patient needs continued PT services    Follow Up Recommendations  Home health PT;Skilled nursing facility (per family preference)    Equipment Recommendations  Defer to next venue    Frequency Min 5X/week    Precautions / Restrictions Precautions Precautions: Fall Restrictions Weight Bearing Restrictions: No   Pertinent Vitals/Pain       Mobility  Bed Mobility Bed Mobility: Supine to Sit;Sitting - Scoot to Edge of Bed;Scooting to HOB Supine to Sit: 4: Min guard;Other (comment) (extremely slow and labored) Sitting - Scoot to Edge of Bed: 5: Supervision (difficulty with anterior weight shift) Scooting to HOB: 5: Supervision Transfers Transfers: Sit to Stand;Stand to Sit Sit to Stand: 4: Min guard Stand to Sit: 4: Min guard Ambulation/Gait Ambulation/Gait Assistance: 4: Min assist Ambulation Distance (Feet): 80 Feet Assistive device: Rolling walker Ambulation/Gait Assistance Details: per family, pt normally ambulates with no assistive device...he currently is unable to ambulate without a walker Gait Pattern: Scissoring;Decreased  dorsiflexion - left;Decreased dorsiflexion - right;Decreased weight shift to left;Decreased weight shift to right;Decreased trunk rotation Gait velocity: slow and labored General Gait Details: dementia type gait pattern Stairs: No Wheelchair Mobility Wheelchair Mobility: No    Exercises     PT Goals Acute Rehab PT Goals PT Goal Formulation: With family Time For Goal Achievement: 05/19/11 Potential to Achieve Goals: Fair Pt will go Supine/Side to Sit: with supervision;with HOB 0 degrees PT Goal: Supine/Side to Sit - Progress: Goal set today Pt will go Sit to Supine/Side: with supervision;with HOB 0 degrees PT Goal: Sit to Supine/Side - Progress: Goal set today Pt will go Sit to Stand: with supervision;with upper extremity assist PT Goal: Sit to Stand - Progress: Goal set today Pt will go Stand to Sit: with supervision;with upper extremity assist PT Goal: Stand to Sit - Progress: Goal set today Pt will Ambulate: 51 - 150 feet;with supervision PT Goal: Ambulate - Progress: Goal set today  Visit Information  Last PT Received On: 05/12/11    Subjective Data  Subjective: daughters are concerned about pt's difficulty in moving Patient Stated Goal: none stated   Prior Functioning  Home Living Lives With: Alone Available Help at Discharge: Family;Available 24 hours/day Type of Home: House Home Access: Stairs to enter [employer] of Steps: 2 Entrance Stairs-Rails: Right Home Layout: One level Home Adaptive Equipment: Walker - rolling Prior Function Level of Independence: Needs assistance Needs Assistance: Light Housekeeping Light Housekeeping: Maximal Able to Take Stairs?: Yes Driving: No Vocation: Retired Musician: Clinical cytogeneticist  Overall Cognitive Status: History  of cognitive impairments - at baseline Arousal/Alertness: Lethargic Orientation Level: Appears intact for tasks assessed;Time;Situation;Disoriented to Behavior During Session:  Flat affect Cognition - Other Comments: per family report, pt is more confused than normal    Extremity/Trunk Assessment Right Upper Extremity Assessment RUE ROM/Strength/Tone: [facility] for tasks assessed Left Upper Extremity Assessment LUE ROM/Strength/Tone: WFL for tasks assessed Right Lower Extremity Assessment RLE ROM/Strength/Tone: Deficits RLE ROM/Strength/Tone Deficits: appears to have increased LE extensor tone seen during gait---he does have antigravity strength in LE RLE Coordination: WFL - gross motor Left Lower Extremity Assessment LLE ROM/Strength/Tone: Deficits LLE ROM/Strength/Tone Deficits: increased extensor tone noted during gait LLE Coordination: WFL - gross motor Trunk Assessment Trunk Assessment: Kyphotic   Balance Balance Balance Assessed: Yes Static Sitting Balance Static Sitting - Level of Assistance: 5: Stand by assistance Dynamic Sitting Balance Dynamic Sitting - Level of Assistance: 5: Stand by assistance Static Standing Balance Static Standing - Level of Assistance: 5: Stand by assistance Dynamic Standing Balance Dynamic Standing - Level of Assistance: 4: Min assist  End of Session PT - End of Session Equipment Utilized During Treatment: Gait belt Activity Tolerance: Patient tolerated treatment well Patient left: in bed;with call bell/phone within reach;with bed alarm set;with family/visitor present Nurse Communication: Mobility status   Konrad Penta 05/12/2011, 3:02 PM

## 2011-05-12 NOTE — Progress Notes (Signed)
UR Chart Review Completed  

## 2011-05-12 NOTE — Progress Notes (Signed)
   CARE MANAGEMENT NOTE 05/12/2011  Patient:  Blake Howard, Blake Howard   Account Number:  [id number]  Date Initiated:  05/12/2011  Documentation initiated by:  Rosemary Holms  Subjective/Objective Assessment:   Pt admitted after multiple falls. PTA lived alone with son living beside him and other family checking in on him when they can. Baseline prior to this sickness was up and walking.     Action/Plan:   PT evaluation is concerning for return imediately to home. May need CSW referral for SNF referral but given Insurance, needs to be made ASAP. If HHPT, [facility]   Anticipated DC Date:  05/14/2011   Anticipated DC Plan:  HOME W HOME HEALTH SERVICES  In-house referral  Clinical Social Worker      DC Planning Services  CM consult      Choice offered to / List presented to:          [facility] arranged  HH-1 RN  HH-10 DISEASE MANAGEMENT  HH-2 PT  HH-6 SOCIAL WORKER      HH agency  [facility]   Status of service:  In process, will continue to follow Medicare Important Message given?   (If response is "NO", the following Medicare IM given date fields will be blank) Date Medicare IM given:   Date Additional Medicare IM given:    Discharge Disposition:    Per UR Regulation:    If discussed at Long Length of Stay Meetings, dates discussed:    Comments:  05/12/11 1400 Lauralie Blacksher Leanord Hawking RN BSN CM

## 2011-05-12 NOTE — Progress Notes (Signed)
Spoke with patient and his daughter at bedside. DIscussed d/c plans with them and the daughter indicates she plans to talk with her siblings about plans. Mentioned the possibility of d/c tomorrow per MD note as well as ?placement- daughter politely declines further discussion about this plan. Updated RNCM and will f/u tomorrow- Reece Levy, MSW, [facility] [phone]

## 2011-05-13 LAB — CBC
HCT: 31.4 % — ABNORMAL LOW (ref 39.0–52.0)
Hemoglobin: 10.7 g/dL — ABNORMAL LOW (ref 13.0–17.0)
MCH: 30.9 pg (ref 26.0–34.0)
MCHC: 34.1 g/dL (ref 30.0–36.0)
MCV: 90.8 fL (ref 78.0–100.0)
RBC: 3.46 MIL/uL — ABNORMAL LOW (ref 4.22–5.81)

## 2011-05-13 LAB — COMPREHENSIVE METABOLIC PANEL
ALT: 24 U/L (ref 0–53)
BUN: 33 mg/dL — ABNORMAL HIGH (ref 6–23)
CO2: 25 mEq/L (ref 19–32)
Calcium: 8.6 mg/dL (ref 8.4–10.5)
Creatinine, Ser: 1.56 mg/dL — ABNORMAL HIGH (ref 0.50–1.35)
GFR calc Af Amer: 47 mL/min — ABNORMAL LOW (ref >90)
GFR calc non Af Amer: 41 mL/min — ABNORMAL LOW (ref >90)
Glucose, Bld: 97 mg/dL (ref 70–99)
Total Protein: 5.5 g/dL — ABNORMAL LOW (ref 6.0–8.3)

## 2011-05-13 MED ORDER — SODIUM CHLORIDE 0.9 % IV SOLN
INTRAVENOUS | Status: DC
  Administered 2011-05-17: 02:00:00 via INTRAVENOUS

## 2011-05-13 NOTE — Progress Notes (Signed)
Physical Therapy Treatment Patient Details Name: Blake Howard MRN: [id number] DOB: 19-Aug-1930 Today's Date: 05/13/2011 Time: 4098-1191 PT Time Calculation (min): 49 min  PT Assessment / Plan / Recommendation Comments on Treatment Session  Pt continues with very flat affect and slow response to questions, however does initiate some comments at times.  He is very cooperative and able to follow directions with slow response time.  There is a decrease in bed mobility today, however we had done more exercise in the bed prior to sitting at EOB and he may have been more fatigued.  He still requires a walker for all gait due to scissoring of LEs.    Follow Up Recommendations       Equipment Recommendations       Frequency     Plan Discharge plan remains appropriate;Frequency remains appropriate    Precautions / Restrictions     Pertinent Vitals/Pain     Mobility  Bed Mobility Supine to Sit: 3: Mod assist Sitting - Scoot to Edge of Bed: 4: Min assist Details for Bed Mobility Assistance: More difficulty with motor planning skills in bed today Transfers Sit to Stand: 4: Min guard Stand to Sit: 4: Min guard Ambulation/Gait Ambulation/Gait Assistance: 4: Min assist Ambulation Distance (Feet): 80 Feet Assistive device: Rolling walker Gait Pattern: Scissoring;Decreased step length - left;Decreased step length - right;Decreased weight shift to right;Decreased weight shift to left;Decreased trunk rotation;Narrow base of support;Decreased dorsiflexion - right;Decreased dorsiflexion - left General Gait Details: slightly less evident tone in LLE today Stairs: No Wheelchair Mobility Wheelchair Mobility: No    Exercises Other Exercises Other Exercises: PNF to UEs in diagonal 1 pattern both unilateral and bilateral with mod assist Other Exercises: Heel slides, alternating DF/PF, trunk rolling, clam shell all x 5-10 reps   PT Goals Acute Rehab PT Goals PT Goal: Supine/Side to Sit - Progress:  Not progressing PT Goal: Sit to Stand - Progress: Progressing toward goal PT Goal: Stand to Sit - Progress: Progressing toward goal PT Goal: Ambulate - Progress: Progressing toward goal  Visit Information  Last PT Received On: 05/13/11    Subjective Data      Cognition  Overall Cognitive Status: History of cognitive impairments - at baseline Arousal/Alertness: Awake/alert Orientation Level: Appears intact for tasks assessed Behavior During Session: Flat affect    Balance     End of Session PT - End of Session Equipment Utilized During Treatment: Gait belt Activity Tolerance: Patient tolerated treatment well Patient left: in chair;with call bell/phone within reach;with family/visitor present    Konrad Penta 05/13/2011, 12:08 PM  2

## 2011-05-13 NOTE — Progress Notes (Signed)
Family now wanting to pursue [facility]. Discussed coverage with them and will proceed with FL2 completion, search and advise- Reece Levy, MSW, [employer] [phone]

## 2011-05-13 NOTE — Progress Notes (Signed)
Subjective: This man is clinically stable now. He seems to be taking by mouth fairly well. Family were concerned that he had Parkinsonian features and I tend to agree. The family is trying to decide whether to take him home or skilled nursing facility which is what has been recommended by physical therapy. Family describe a shuffling gait.           Physical Exam: Blood pressure 121/67, pulse 119, temperature 98.4 F (36.9 C), temperature source Oral, resp. rate 20, height 5\' 4"  (1.626 m), weight 64.7 kg (142 lb 10.2 oz), SpO2 86.00%. He looks systemically well. Heart sounds are present and normal. Lung fields are clear. He is alert and orientated to place and person. There are no obvious focal neurological signs but there does seem to be some increased tone in his arms, possibly cogwheeling.   Investigations:     Basic Metabolic Panel:  Basename 05/13/11 0515 05/12/11 1349  NA 137 140  K 4.0 4.3  CL 105 108  CO2 25 24  GLUCOSE 97 134*  BUN 33* 44*  CREATININE 1.56* 1.72*  CALCIUM 8.6 8.8  MG -- --  PHOS -- --       CBC:  Basename 05/13/11 0515 05/12/11 0528 05/11/11 2004  WBC 7.4 6.9 --  NEUTROABS -- -- 6.9  HGB 10.7* 11.2* --  HCT 31.4* 33.1* --  MCV 90.8 91.7 --  PLT 134* 140* --    Dg Chest 2 View  05/11/2011  *RADIOLOGY REPORT*  Clinical Data: 78 year old male status post fall.  Pain.  CHEST - 2 VIEW  Comparison: 03/06/2009.  Findings: Semi upright AP and lateral views of the chest.  Low lung volumes.  Cardiac size and mediastinal contours are within normal limits.  Visualized tracheal air column is within normal limits. No pneumothorax, pulmonary edema, pleural effusion or confluent pulmonary opacity. No acute osseous abnormality identified.  IMPRESSION: Low lung volumes, otherwise no acute cardiopulmonary abnormality.  Original Report Authenticated By: Harley Hallmark, M.D.   Dg Thoracic Spine 2 View  05/12/2011  *RADIOLOGY REPORT*  Clinical Data:  78 year old male with pain.  THORACIC SPINE - 2 VIEW  Comparison: Chest radiographs 05/11/2011 and earlier.  Findings: Normal thoracic segmentation. Cervicothoracic junction alignment is within normal limits.  Thoracic vertebral bodies appears stable and intact.  Relatively preserved disc spaces. Grossly stable visualized thoracic visceral contours.  Calcified atherosclerosis of the aorta.  IMPRESSION: No acute fracture or listhesis identified in the thoracic spine.  Original Report Authenticated By: Harley Hallmark, M.D.   Dg Lumbar Spine 2-3 Views  05/12/2011  *RADIOLOGY REPORT*  Clinical Data: 78 year old male with back pain.  LUMBAR SPINE - 2-3 VIEW  Comparison: Abdominal radiographs 07/19/2006 and earlier.  Findings: Increased levoconvex lumbar scoliosis with rotatory component and 2008.  Normal lumbar segmentation.  Calcified atherosclerosis of the aorta and iliac arteries.  Chronic endplate spurring at L4-L5 may result in functional ankylosis.  Mild to moderate lumbar disc space narrowing elsewhere with lesser endplate spurring.  No lumbar or lower thoracic compression fracture.  No spondylolisthesis.  IMPRESSION: Progression of levoconvex scoliosis with rotatory component and 2008. No acute osseous abnormality in the lumbar spine.  Original Report Authenticated By: Harley Hallmark, M.D.   Dg Elbow Complete Right  05/11/2011  *RADIOLOGY REPORT*  Clinical Data: 78 year old male status post fall.  Pain.  RIGHT ELBOW - COMPLETE 3+ VIEW  Comparison: None.  Findings: Bulky posterior osteophyte off of the olecranon appears grossly intact.  No definite  joint effusion.  No radial head fracture identified.  No distal humerus or proximal ulna fracture identified.  Multiple punctate hyperdensities over the biceps and forearm area may reflect screen artifact or small foreign bodies.  IMPRESSION: No definite acute fracture or dislocation of the right elbow. Screen artifact versus small radiodense foreign bodies,  clinical correlation recommended.  Original Report Authenticated By: Harley Hallmark, M.D.   Ct Head Wo Contrast  05/12/2011  *RADIOLOGY REPORT*  Clinical Data: Followup subdural hematoma.  CT HEAD WITHOUT CONTRAST  Technique:  Contiguous axial images were obtained from the base of the skull through the vertex without contrast.  Comparison: 05/11/2011 and 03/07/2009 CT.  03/06/2009 MR.  Findings: Complex left convexity extra-axial collection appears similar to the recent CT.  This may represent a mixed acute and subacute subdural hematoma with septations causing the lenticular appearance. Epidural component not entirely excluded.  In the proper clinical setting, empyema would also need to be considered however there is no surrounding vasogenic edema and there is a history of recent falls.  Atrophy.  Ventricular prominence may be related to atrophy however, mild hydrocephalus not excluded. Appearance without significant change.  Small vessel disease type changes.  Remote infarct left caudate. No CT evidence of large acute infarct.  No intracranial mass lesion detected on this unenhanced exam.  Lobular prominence of the posterior-superior nasopharynx left paracentral position.  This may represent a Thornwaldt cyst. Direct visualization would be necessary to exclude primary mucosal abnormality.  IMPRESSION: No significant change.  Please see above.  Original Report Authenticated By: Fuller Canada, M.D.   Ct Head Wo Contrast  05/11/2011  *RADIOLOGY REPORT*  Clinical Data: Fall  CT HEAD WITHOUT CONTRAST  Technique:  Contiguous axial images were obtained from the base of the skull through the vertex without contrast.  Comparison: MRI 03/06/2009, head CT 03/06/2009  Findings: Right basal ganglial lacunar infarct again noted. Cortical volume loss noted with proportional ventricular prominence.  Periventricular white matter hypodensity likely indicates small vessel ischemic change.  There is a mixed density lenticular  left extra-axial fluid collection.  No overlying skull fracture.  No midline shift.  Left and paranasal sinuses are intact.  No acute infarct or mass lesion identified.  IMPRESSION: Mixed density left subdural hematoma most compatible with mixed acute versus subacute/chronic evolution.  Critical Value/emergent results were called by telephone at the time of interpretation on 05/11/2011  at 7:20 p.m.  to  Dr. Manus Gunning, who verbally acknowledged these results.  Original Report Authenticated By: Harrel Lemon, M.D.   Ct Pelvis Wo Contrast  05/11/2011  *RADIOLOGY REPORT*  Clinical Data:  Fall.  Back pain.  Right hip pain.  CT PELVIS WITHOUT CONTRAST  Technique:  Multidetector CT imaging of the pelvis was performed following the standard protocol without intravenous contrast.  Comparison:  Two-view abdomen 07/19/2006.  Findings:  The hips are located bilaterally.  A remote right superior and inferior pubic ramus fracture is healed.  No acute fracture is evident.  The sacrum and coccyx are intact.  Degenerative changes are noted in the SI joints bilaterally.  There is loss of disc height and endplate degenerative change at L4-5 and L5-S1.  Foraminal narrowing is worse on the left at L4-5.  Mild foraminal narrowing is present bilaterally at L5-S1.  Atherosclerotic calcifications are noted in the aorta and branch vessels.  There is no aneurysm.  The visualized bowel is unremarkable.  Urinary bladder is within normal limits.  No significant adenopathy or free fluid  is evident.  IMPRESSION:  1.  Degenerative changes of the lower lumbar spine and SI joints bilaterally. 2.  No acute fracture. 3.  Remote fractures of the right superior and inferior pubic ramus are healed. 4.  Atherosclerosis.  Original Report Authenticated By: Jamesetta Orleans. MATTERN, M.D.      Medications:  Scheduled:    . sodium chloride  1,000 mL Intravenous Once  . sodium chloride  3 mL Intravenous Q12H    Impression: 1. Subdural hematoma,  stable. 2. Frequent falls. 3. Hypertension. 4. Dementia. Possible Parkinson's disease. 5. Lumbosacral back pain. 6. Acute renal failure, improving, secondary to dehydration.     Plan: 1. Gentle IV fluids for today. 2. Neurology consultation. 3. Disposition-skilled nursing facility for rehabilitation.     LOS: 2 days   Wilson Singer Pager [phone]  05/13/2011, 10:47 AM

## 2011-05-14 LAB — COMPREHENSIVE METABOLIC PANEL
ALT: 21 U/L (ref 0–53)
Alkaline Phosphatase: 54 U/L (ref 39–117)
Chloride: 103 mEq/L (ref 96–112)
GFR calc Af Amer: 54 mL/min — ABNORMAL LOW (ref >90)
Glucose, Bld: 97 mg/dL (ref 70–99)
Potassium: 4.1 mEq/L (ref 3.5–5.1)
Sodium: 135 mEq/L (ref 135–145)
Total Bilirubin: 0.4 mg/dL (ref 0.3–1.2)
Total Protein: 5.6 g/dL — ABNORMAL LOW (ref 6.0–8.3)

## 2011-05-14 LAB — CBC
HCT: 31.4 % — ABNORMAL LOW (ref 39.0–52.0)
Hemoglobin: 11.1 g/dL — ABNORMAL LOW (ref 13.0–17.0)
MCHC: 35.4 g/dL (ref 30.0–36.0)
RBC: 3.55 MIL/uL — ABNORMAL LOW (ref 4.22–5.81)
WBC: 6.9 10*3/uL (ref 4.0–10.5)

## 2011-05-14 NOTE — Progress Notes (Signed)
Clinical Social Work Department BRIEF PSYCHOSOCIAL ASSESSMENT 05/14/2011  Patient:  Blake Howard, Blake Howard     Account Number:  [id number]     Admit date:  05/11/2011  Clinical Social Worker:  Robin Searing  Date/Time:  05/13/2011 03:00 PM  Referred by:  Physician  Date Referred:  05/13/2011 Referred for  SNF Placement   Other Referral:   Interview type:  Family Other interview type:   2 DAUGHTERS AND A SON IN LAW AT BEDSIDE.    PSYCHOSOCIAL DATA Living Status:  ALONE Admitted from facility:   Level of care:   Primary support name:  3 DAUGHTERS, 2 SONS, ETC Primary support relationship to patient:  FAMILY Degree of support available:   MINIMAL ASSISTANCE AVAILABLE    CURRENT CONCERNS Current Concerns  Post-Acute Placement   Other Concerns:    SOCIAL WORK ASSESSMENT / PLAN Family interested in pursuing SNF- FL2 to be completed and faxed out for SNF offers-   Assessment/plan status:  Other - See comment Other assessment/ plan:   FL2 to be completed and faxed out for SNF offers-   Information/referral to community resources:    PATIENT'S/FAMILY'S RESPONSE TO PLAN OF CARE: Family agreeable to SNF search.    Reece Levy, MSW, [facility] [phone]

## 2011-05-14 NOTE — Progress Notes (Signed)
Clinical Social Work Department CLINICAL SOCIAL WORK PLACEMENT NOTE 05/14/2011  Patient:  Blake Howard, Blake Howard  Account Number:  [id number] Admit date:  05/11/2011  Clinical Social Worker:  Robin Searing  Date/time:  05/14/2011 10:56 AM  Clinical Social Work is seeking post-discharge placement for this patient at the following level of care:   SKILLED NURSING   (*CSW will update this form in Epic as items are completed)   05/13/2011  Patient/family provided with [facility] Department of Clinical Social Work's list of facilities offering this level of care within the geographic area requested by the patient (or if unable, by the patient's family).  05/13/2011  Patient/family informed of their freedom to choose among providers that offer the needed level of care, that participate in Medicare, Medicaid or managed care program needed by the patient, have an available bed and are willing to accept the patient.  05/13/2011  Patient/family informed of [facility]' ownership interest in [facility], as well as of the fact that they are under no obligation to receive care at this facility.  PASARR submitted to [employer] on 05/14/2011 PASARR number received from [employer] on   FL2 transmitted to all facilities in geographic area requested by pt/family on  05/14/2011 FL2 transmitted to all facilities within larger geographic area on   Patient informed that his/her managed care company has contracts with or will negotiate with  certain facilities, including the following:   [facility] Medicare coverage and approval discussed     Patient/family informed of bed offers received:   Patient chooses bed at  Physician recommends and patient chooses bed at    Patient to be transferred to  on   Patient to be transferred to facility by   The following physician request were entered in Epic:   Additional Comments: SNF will require prior auth for placement with [facility].  Reece Levy, MSW,  [facility] [phone]

## 2011-05-14 NOTE — Progress Notes (Signed)
Subjective: This man is clinically stable now. He seems to be taking by mouth fairly well. Family were concerned that he had Parkinsonian features and I tend to agree. The family has decided that it is better for him to be in a skilled nursing facility for rehabilitation in the near future. Social work will start a Nutritional therapist.           Physical Exam: Blood pressure 154/78, pulse 60, temperature 98.1 F (36.7 C), temperature source Oral, resp. rate 20, height 5\' 4"  (1.626 m), weight 64.7 kg (142 lb 10.2 oz), SpO2 93.00%. He looks systemically well. Heart sounds are present and normal. Lung fields are clear. He is alert and orientated to place and person. There are no obvious focal neurological signs but there does seem to be some increased tone in his arms, possibly cogwheeling.   Investigations:     Basic Metabolic Panel:  Basename 05/14/11 0519 05/13/11 0515  NA 135 137  K 4.1 4.0  CL 103 105  CO2 26 25  GLUCOSE 97 97  BUN 24* 33*  CREATININE 1.39* 1.56*  CALCIUM 8.8 8.6  MG -- --  PHOS -- --       CBC:  Basename 05/14/11 0519 05/13/11 0515 05/11/11 2004  WBC 6.9 7.4 --  NEUTROABS -- -- 6.9  HGB 11.1* 10.7* --  HCT 31.4* 31.4* --  MCV 88.5 90.8 --  PLT 123* 134* --    Dg Thoracic Spine 2 View  05/12/2011  *RADIOLOGY REPORT*  Clinical Data: 78 year old male with pain.  THORACIC SPINE - 2 VIEW  Comparison: Chest radiographs 05/11/2011 and earlier.  Findings: Normal thoracic segmentation. Cervicothoracic junction alignment is within normal limits.  Thoracic vertebral bodies appears stable and intact.  Relatively preserved disc spaces. Grossly stable visualized thoracic visceral contours.  Calcified atherosclerosis of the aorta.  IMPRESSION: No acute fracture or listhesis identified in the thoracic spine.  Original Report Authenticated By: Harley Hallmark, M.D.   Dg Lumbar Spine 2-3 Views  05/12/2011  *RADIOLOGY REPORT*  Clinical Data: 78 year old male with back  pain.  LUMBAR SPINE - 2-3 VIEW  Comparison: Abdominal radiographs 07/19/2006 and earlier.  Findings: Increased levoconvex lumbar scoliosis with rotatory component and 2008.  Normal lumbar segmentation.  Calcified atherosclerosis of the aorta and iliac arteries.  Chronic endplate spurring at L4-L5 may result in functional ankylosis.  Mild to moderate lumbar disc space narrowing elsewhere with lesser endplate spurring.  No lumbar or lower thoracic compression fracture.  No spondylolisthesis.  IMPRESSION: Progression of levoconvex scoliosis with rotatory component and 2008. No acute osseous abnormality in the lumbar spine.  Original Report Authenticated By: Harley Hallmark, M.D.   Ct Head Wo Contrast  05/12/2011  *RADIOLOGY REPORT*  Clinical Data: Followup subdural hematoma.  CT HEAD WITHOUT CONTRAST  Technique:  Contiguous axial images were obtained from the base of the skull through the vertex without contrast.  Comparison: 05/11/2011 and 03/07/2009 CT.  03/06/2009 MR.  Findings: Complex left convexity extra-axial collection appears similar to the recent CT.  This may represent a mixed acute and subacute subdural hematoma with septations causing the lenticular appearance. Epidural component not entirely excluded.  In the proper clinical setting, empyema would also need to be considered however there is no surrounding vasogenic edema and there is a history of recent falls.  Atrophy.  Ventricular prominence may be related to atrophy however, mild hydrocephalus not excluded. Appearance without significant change.  Small vessel disease type changes.  Remote infarct left  caudate. No CT evidence of large acute infarct.  No intracranial mass lesion detected on this unenhanced exam.  Lobular prominence of the posterior-superior nasopharynx left paracentral position.  This may represent a Thornwaldt cyst. Direct visualization would be necessary to exclude primary mucosal abnormality.  IMPRESSION: No significant change.   Please see above.  Original Report Authenticated By: Fuller Canada, M.D.      Medications:  Scheduled:    . sodium chloride  3 mL Intravenous Q12H    Impression: 1. Subdural hematoma, stable. 2. Frequent falls. 3. Hypertension. 4. Dementia. Possible Parkinson's disease. 5. Lumbosacral back pain. 6. Acute renal failure, improving, secondary to dehydration.     Plan: 1. Gentle IV fluids for today. 2. Neurology consultation pending. 3. Disposition-skilled nursing facility for rehabilitation.     LOS: 3 days   Wilson Singer Pager [phone]  05/14/2011, 9:59 AM

## 2011-05-15 LAB — BASIC METABOLIC PANEL
CO2: 27 mEq/L (ref 19–32)
Calcium: 9.1 mg/dL (ref 8.4–10.5)
Creatinine, Ser: 1.43 mg/dL — ABNORMAL HIGH (ref 0.50–1.35)
GFR calc non Af Amer: 45 mL/min — ABNORMAL LOW (ref >90)
Glucose, Bld: 100 mg/dL — ABNORMAL HIGH (ref 70–99)

## 2011-05-15 MED ORDER — RIVASTIGMINE 4.6 MG/24HR TD PT24
4.6000 mg | MEDICATED_PATCH | Freq: Every day | TRANSDERMAL | Status: DC
  Administered 2011-05-15 – 2011-05-19 (×5): 4.6 mg via TRANSDERMAL
  Filled 2011-05-15 (×6): qty 1

## 2011-05-15 NOTE — Progress Notes (Signed)
Notified Dr. Randol Kern of familles concerns of the patient appearing to be worst than when he came in. I voiced to him that the patient could move extremities and eating/drinking well AEB 100% of lunch eaten and taking in po liquids without difficulty.  I discussed with the patients family that the neurologist would see the patient tomorrow and most likely. I also discussed with the MD that the patient was on a exelon patch at home daily.  Family verbalized understanding. New orders given and followed.

## 2011-05-15 NOTE — Progress Notes (Signed)
Patient refused scd's.

## 2011-05-15 NOTE — Progress Notes (Signed)
During the shift, patient has become hostile and combative to staff.  He has made several attempts to get out of bed.  He has used his call light on several occassions and would not respond to staff when assistance was offered.  At present, he is pulling out his IV.  Primary nurse has called patient's daughter and she is on her way.

## 2011-05-15 NOTE — Progress Notes (Signed)
Subjective: This man is clinically stable now. He seems to be taking by mouth fairly well. Family were concerned that he had Parkinsonian features and I tend to agree. The family has decided that it is better for him to be in a skilled nursing facility for rehabilitation in the near future. Social work will start a Nutritional therapist. Yesterday he was somewhat combative but he seems calmer today. He does not really engage in a conversation with me although he is alert.           Physical Exam: Blood pressure 130/70, pulse 64, temperature 97.6 F (36.4 C), temperature source Oral, resp. rate 20, height 5\' 4"  (1.626 m), weight 64.7 kg (142 lb 10.2 oz), SpO2 98.00%. He looks systemically well. Heart sounds are present and normal. Lung fields are clear. He is alert and orientated to place and person. There are no obvious focal neurological signs but there does seem to be some increased tone in his arms, possibly cogwheeling.   Investigations:     Basic Metabolic Panel:  Basename 05/14/11 0519 05/13/11 0515  NA 135 137  K 4.1 4.0  CL 103 105  CO2 26 25  GLUCOSE 97 97  BUN 24* 33*  CREATININE 1.39* 1.56*  CALCIUM 8.8 8.6  MG -- --  PHOS -- --       CBC:  Basename 05/14/11 0519 05/13/11 0515  WBC 6.9 7.4  NEUTROABS -- --  HGB 11.1* 10.7*  HCT 31.4* 31.4*  MCV 88.5 90.8  PLT 123* 134*    No results found.    Medications:  Scheduled:    . sodium chloride  3 mL Intravenous Q12H    Impression: 1. Subdural hematoma, stable. 2. Frequent falls. 3. Hypertension. 4. Dementia. Possible Parkinson's disease. 5. Lumbosacral back pain. 6. Acute renal failure, improving, secondary to dehydration.     Plan: 1. Neurology consultation pending. 2. Disposition-patient is medically stable for discharge to a skilled nursing facility, when a bed is found.     LOS: 4 days   Wilson Singer Pager [phone]  05/15/2011, 10:06 AM

## 2011-05-16 LAB — CBC
MCH: 31.1 pg (ref 26.0–34.0)
MCV: 88.3 fL (ref 78.0–100.0)
Platelets: 142 10*3/uL — ABNORMAL LOW (ref 150–400)
RDW: 12.7 % (ref 11.5–15.5)
WBC: 7.1 10*3/uL (ref 4.0–10.5)

## 2011-05-16 LAB — COMPREHENSIVE METABOLIC PANEL
AST: 14 U/L (ref 0–37)
Albumin: 2.8 g/dL — ABNORMAL LOW (ref 3.5–5.2)
Calcium: 8.8 mg/dL (ref 8.4–10.5)
Creatinine, Ser: 1.44 mg/dL — ABNORMAL HIGH (ref 0.50–1.35)
Sodium: 134 mEq/L — ABNORMAL LOW (ref 135–145)

## 2011-05-16 MED ORDER — SODIUM CHLORIDE 0.9 % IJ SOLN
INTRAMUSCULAR | Status: AC
  Administered 2011-05-16: 11:00:00
  Filled 2011-05-16: qty 3

## 2011-05-16 MED ORDER — CARBIDOPA-LEVODOPA 25-100 MG PO TABS
1.0000 | ORAL_TABLET | Freq: Two times a day (BID) | ORAL | Status: DC
  Administered 2011-05-16 – 2011-05-19 (×7): 1 via ORAL
  Filled 2011-05-16 (×7): qty 1

## 2011-05-16 NOTE — Progress Notes (Signed)
SNF bed offers provided to patient's family- they have selected [facility] and we are awaiting PASARR #  And [employer] Medicare auth for SNF.  Will advise as this is finalized for transfer- Reece Levy, MSW, [employer] [phone]

## 2011-05-16 NOTE — Progress Notes (Signed)
Subjective: This man is clinically stable now. He seems to be taking by mouth fairly well. Family were concerned that he had Parkinsonian features and I tend to agree. The family has decided that it is better for him to be in a skilled nursing facility for rehabilitation in the near future. Social work will start a Nutritional therapist. Yesterday was concern about his lack of movement. I restarted the patient's Exelon patch, or as the family wished this. Today he is somewhat better. We're still awaiting neurology consultation, which I expect will happen today.           Physical Exam: Blood pressure 130/69, pulse 70, temperature 98.4 F (36.9 C), temperature source Oral, resp. rate 20, height 5\' 4"  (1.626 m), weight 64.7 kg (142 lb 10.2 oz), SpO2 95.00%. He looks systemically well. Heart sounds are present and normal. Lung fields are clear. He is alert and orientated to place and person. There are no obvious focal neurological signs but there does seem to be some increased tone in his arms, possibly cogwheeling.   Investigations:     Basic Metabolic Panel:  Basename 05/16/11 0520 05/15/11 0932  NA 134* 135  K 3.8 4.2  CL 100 102  CO2 27 27  GLUCOSE 100* 100*  BUN 21 22  CREATININE 1.44* 1.43*  CALCIUM 8.8 9.1  MG -- --  PHOS -- --       CBC:  Basename 05/16/11 0520 05/14/11 0519  WBC 7.1 6.9  NEUTROABS -- --  HGB 11.4* 11.1*  HCT 32.4* 31.4*  MCV 88.3 88.5  PLT 142* 123*        Medications:  Scheduled:    . rivastigmine  4.6 mg Transdermal Daily  . sodium chloride  3 mL Intravenous Q12H    Impression: 1. Subdural hematoma, stable. 2. Frequent falls. 3. Hypertension. 4. Dementia. Possible Parkinson's disease. 5. Lumbosacral back pain. 6. Acute on chronic kidney disease.     Plan: 1. Neurology consultation pending. IV fluids today. 2. Disposition-patient is medically stable for discharge to a skilled nursing facility, when a bed is found.     LOS:  5 days   Wilson Singer Pager [phone]  05/16/2011, 7:57 AM

## 2011-05-16 NOTE — Consult Note (Signed)
NAMECAEL, WORTH                  ACCOUNT NO.:  [id number]  MEDICAL RECORD NO.:  [id number]  LOCATION:  A337                          FACILITY:  [facility]  PHYSICIAN:  Tulani Kidney A. Gerilyn Pilgrim, M.D. DATE OF BIRTH:  August 20, 1930  DATE OF CONSULTATION: DATE OF DISCHARGE:                                CONSULTATION   HISTORY OF PRESENT ILLNESS:  The patient is a 78 year old white male who has a baseline history of dementia, although he typically is able to function fairly well.  He lives by himself, although his son lives behind him. He is independent with ADLs. 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  He has been having increasing falls last year or so.  He again has about 3-year history of dementia. The patient recently had head CT scan, showed intracranial hemorrhage involving the left convexity, consistent with a subdural, although the radiologist raises suspicion that also could be an epidural, repeat scan shows no change in the scan.  Family has had concerns about patient having Parkinson disease based up on apparently, he has had some shuffling gait and expressionless face which revealed a mask face and possibly some tremors of hands.  They wanted confirmation in his neurological consultation.  The patient's symptom being less responsive than he typically is at home.  PHYSICAL EXAMINATION:  GENERAL:  Thin man in no acute distress. HEENT EVALUATION:  Neck is supple.  Head is normocephalic, atraumatic. ABDOMEN:  Soft. EXTREMITIES:  Superficial bruise left upper extremity involving the forearm. MENTATION:  The patient is awake. He does really follow commands and did get a little belligerents during the evaluation. He seems fixated on his breakfast repeating numerous times that he has finished with his breakfast, otherwise no verbal output was obtained.  Speech seems fine. He does follow actual commands with much prompting.  Again, speech is fine.  No dysarthria.  No hypophonia. CRANIAL NERVE EVALUATION:  Responds to direct threat bilaterally indicating full visual fields.  Extraocular movements are full, including vertical eye movements, which indicates pupils equal, round, and reactive.  Face muscle strength is symmetric.  Tongue is midline. Motor examination shows antigravity strength throughout.  He does seem to have significant rigidity and cogwheeling.  Coordination, there is no dysmetria.  I did not see significant number of tremors. He may have had a couple of rest tremor intermittently, although this is not quite certain.  Again, he has significant cogwheeling and rigidity.  Reflexes are slightly brisk bilaterally. Respond to painful stimuli bilaterally. Gait was wide based. He did have significant shuffling and start hesitation.  The gait was otherwise somewhat limited due to lack of cooperation.  CT scan reveal small subdural what also appears to be associated epidural, left frontal area.  Repeat scan shows no change.  Significant labs done in February shows RPR nonreactive.  TSH 1.6, vitamin B12 level of 1189.  ASSESSMENT:  Gait disorder with significant parkinsonism and dementia. The patient likely does not have typical idiopathic Parkinson disease, but certainly he appears to have parkinsonism with associated dementia.  RECOMMENDATION:  I think a week, the patient should undergo a trial of Sinemet.  We will  start him 25/500 b.i.d., increase to t.i.d. dosing after 10-14 days. The patient is on Exelon that could be increased If he has been on them greater than a month, could be increased to 9.5 packs dose.     Preciosa Bundrick A. Gerilyn Pilgrim, M.D.     KAD/MEDQ  D:  05/16/2011  T:  05/16/2011  Job:  [id number]

## 2011-05-16 NOTE — Plan of Care (Signed)
Problem: Phase II Progression Outcomes Goal: Discharge plan established Outcome: Progressing Pts family plans for patient to go to SNF at d/c.  Social Worker consult in place.

## 2011-05-16 NOTE — Progress Notes (Signed)
Physical Therapy Treatment Patient Details Name: Blake Howard MRN: [id number] DOB: 02-03-1930 Today's Date: 05/16/2011 Time: 5784-6962 PT Time Calculation (min): 58 min  PT Assessment / Plan / Recommendation Comments on Treatment Session  Pt continues about the same.  Extremely slow to respond and often just stares blankly...however, he does follow all directions and tries his best.  He continues to have significant trunk  and rigidity throughout.    Follow Up Recommendations       Equipment Recommendations       Frequency     Plan Discharge plan remains appropriate;Frequency remains appropriate    Precautions / Restrictions Precautions Precautions: Fall Restrictions Weight Bearing Restrictions: No   Pertinent Vitals/Pain     Mobility  Bed Mobility Bed Mobility: Supine to Sit;Sit to Supine Supine to Sit: 3: Mod assist Sitting - Scoot to Edge of Bed: 4: Min assist Sit to Supine: 4: Min assist Transfers Sit to Stand: 4: Min guard Stand to Sit: 4: Min guard Ambulation/Gait Ambulation/Gait Assistance: 4: Min Environmental consultant (Feet): 80 Feet Assistive device: Rolling walker Ambulation/Gait Assistance Details: assist needed to guide walker, cues for increased base of support Gait Pattern: Decreased step length - left;Decreased step length - right;Trunk flexed;Decreased dorsiflexion - right;Decreased dorsiflexion - left;Decreased trunk rotation;Decreased weight shift to left;Decreased weight shift to right;Narrow base of support General Gait Details: less scissoring in gait Stairs: No Wheelchair Mobility Wheelchair Mobility: No    Exercises Other Exercises Other Exercises: same program utilized today Other Exercises: same program with addition of bridging x 5 reps-only able to lift hips up less than an inch   PT Goals Acute Rehab PT Goals PT Goal: Supine/Side to Sit - Progress: Progressing toward goal PT Goal: Sit to Supine/Side - Progress: Progressing toward  goal PT Goal: Sit to Stand - Progress: Progressing toward goal PT Goal: Stand to Sit - Progress: Progressing toward goal PT Goal: Ambulate - Progress: Progressing toward goal  Visit Information  Last PT Received On: 05/16/11    Subjective Data  Subjective: no c/o Patient Stated Goal: none stated   Cognition       Balance     End of Session PT - End of Session Equipment Utilized During Treatment: Gait belt Activity Tolerance: Patient tolerated treatment well Patient left: in bed;with call bell/phone within reach;with bed alarm set;with family/visitor present    Konrad Penta 05/16/2011, 3:46 PM

## 2011-05-16 NOTE — Consult Note (Signed)
Reason for Consult: Referring Physician:   [name] is an 78 y.o. male.  HPI:   Past Medical History  Diagnosis Date  . Dementia   . Hypertension   . Anemia     Past Surgical History  Procedure Date  . Hand surgery   . Colon surgery     History reviewed. No pertinent family history.  Social History:  reports that he has quit smoking. His smoking use included Cigarettes. He has never used smokeless tobacco. He reports that he does not drink alcohol or use illicit drugs.  Allergies: No Known Allergies   Medications:  Scheduled Meds:   . rivastigmine  4.6 mg Transdermal Daily  . sodium chloride  3 mL Intravenous Q12H   Continuous Infusions:   . sodium chloride     PRN Meds:.  Prior to Admission medications   Medication Sig Start Date End Date Taking? Authorizing Provider  fish oil-omega-3 fatty acids 1000 MG capsule Take 1 g by mouth daily.   Yes Historical Provider, MD  Homeopathic Products (CALENDULA) CREA Apply 1 application topically daily as needed. For skin irritation   Yes Historical Provider, MD  lisinopril (PRINIVIL,ZESTRIL) 10 MG tablet Take 10 mg by mouth daily.   Yes Historical Provider, MD  Multiple Vitamin (MULITIVITAMIN WITH MINERALS) TABS Take 1 tablet by mouth daily.   Yes Historical Provider, MD  rivastigmine (EXELON) 4.6 mg/24hr Place 1 patch onto the skin daily.   Yes Historical Provider, MD    Results for orders placed during the hospital encounter of 05/11/11 (from the past 48 hour(s))  BASIC METABOLIC PANEL     Status: Abnormal   Collection Time   05/15/11  9:32 AM      Component Value Range Comment   Sodium 135  135 - 145 (mEq/L)    Potassium 4.2  3.5 - 5.1 (mEq/L)    Chloride 102  96 - 112 (mEq/L)    CO2 27  19 - 32 (mEq/L)    Glucose, Bld 100 (*) 70 - 99 (mg/dL)    BUN 22  6 - 23 (mg/dL)    Creatinine, Ser 1.61 (*) 0.50 - 1.35 (mg/dL)    Calcium 9.1  8.4 - 10.5 (mg/dL)    GFR calc non Af Amer 45 (*) >90 (mL/min)    GFR calc Af  Amer 52 (*) >90 (mL/min)   CBC     Status: Abnormal   Collection Time   05/16/11  5:20 AM      Component Value Range Comment   WBC 7.1  4.0 - 10.5 (K/uL)    RBC 3.67 (*) 4.22 - 5.81 (MIL/uL)    Hemoglobin 11.4 (*) 13.0 - 17.0 (g/dL)    HCT 09.6 (*) 04.5 - 52.0 (%)    MCV 88.3  78.0 - 100.0 (fL)    MCH 31.1  26.0 - 34.0 (pg)    MCHC 35.2  30.0 - 36.0 (g/dL)    RDW 40.9  81.1 - 91.4 (%)    Platelets 142 (*) 150 - 400 (K/uL)   COMPREHENSIVE METABOLIC PANEL     Status: Abnormal   Collection Time   05/16/11  5:20 AM      Component Value Range Comment   Sodium 134 (*) 135 - 145 (mEq/L)    Potassium 3.8  3.5 - 5.1 (mEq/L)    Chloride 100  96 - 112 (mEq/L)    CO2 27  19 - 32 (mEq/L)    Glucose, Bld 100 (*) 70 -  99 (mg/dL)    BUN 21  6 - 23 (mg/dL)    Creatinine, Ser 2.13 (*) 0.50 - 1.35 (mg/dL)    Calcium 8.8  8.4 - 10.5 (mg/dL)    Total Protein 5.8 (*) 6.0 - 8.3 (g/dL)    Albumin 2.8 (*) 3.5 - 5.2 (g/dL)    AST 14  0 - 37 (U/L)    ALT 20  0 - 53 (U/L)    Alkaline Phosphatase 62  39 - 117 (U/L)    Total Bilirubin 0.3  0.3 - 1.2 (mg/dL)    GFR calc non Af Amer 45 (*) >90 (mL/min)    GFR calc Af Amer 52 (*) >90 (mL/min)     No results found.  Review of Systems  Unable to perform ROS: dementia   Blood pressure 130/69, pulse 70, temperature 98.4 F (36.9 C), temperature source Oral, resp. rate 20, height 5\' 4"  (1.626 m), weight 64.7 kg (142 lb 10.2 oz), SpO2 95.00%. Physical Exam  Assessment/Plan: See dictation  Blake Howard 05/16/2011, 8:29 AM

## 2011-05-17 LAB — CBC
HCT: 33.6 % — ABNORMAL LOW (ref 39.0–52.0)
Hemoglobin: 11.6 g/dL — ABNORMAL LOW (ref 13.0–17.0)
MCH: 30.9 pg (ref 26.0–34.0)
MCV: 89.6 fL (ref 78.0–100.0)
RBC: 3.75 MIL/uL — ABNORMAL LOW (ref 4.22–5.81)

## 2011-05-17 LAB — COMPREHENSIVE METABOLIC PANEL
ALT: <5 U/L (ref 0–53)
BUN: 18 mg/dL (ref 6–23)
CO2: 26 mEq/L (ref 19–32)
Calcium: 8.7 mg/dL (ref 8.4–10.5)
Creatinine, Ser: 1.36 mg/dL — ABNORMAL HIGH (ref 0.50–1.35)
GFR calc Af Amer: 55 mL/min — ABNORMAL LOW (ref >90)
GFR calc non Af Amer: 48 mL/min — ABNORMAL LOW (ref >90)
Glucose, Bld: 93 mg/dL (ref 70–99)
Sodium: 139 mEq/L (ref 135–145)

## 2011-05-17 NOTE — Progress Notes (Signed)
Subjective: This man is clinically stable now. He seems to be taking by mouth fairly well. Family were concerned that he had Parkinsonian features and I tend to agree. The family has decided that it is better for him to be in a skilled nursing facility for rehabilitation in the near future. Social work will start a Nutritional therapist. Dr Gerilyn Pilgrim, neurology saw this patient yesterday and has started him on Sinemet as he thinks he does have Parkinson's disease.            Physical Exam: Blood pressure 157/78, pulse 69, temperature 97.7 F (36.5 C), temperature source Oral, resp. rate 20, height 5\' 4"  (1.626 m), weight 64.7 kg (142 lb 10.2 oz), SpO2 96.00%. He looks systemically well. Heart sounds are present and normal. Lung fields are clear. He is alert and orientated to place and person. There are no obvious focal neurological signs but there does seem to be some increased tone in his arms, possibly cogwheeling.   Investigations:     Basic Metabolic Panel:  Basename 05/17/11 0607 05/16/11 0520  NA 139 134*  K 4.0 3.8  CL 106 100  CO2 26 27  GLUCOSE 93 100*  BUN 18 21  CREATININE 1.36* 1.44*  CALCIUM 8.7 8.8  MG -- --  PHOS -- --       CBC:  Basename 05/17/11 0607 05/16/11 0520  WBC 6.6 7.1  NEUTROABS -- --  HGB 11.6* 11.4*  HCT 33.6* 32.4*  MCV 89.6 88.3  PLT 137* 142*        Medications:  Scheduled:    . carbidopa-levodopa  1 tablet Oral BID  . rivastigmine  4.6 mg Transdermal Daily  . sodium chloride  3 mL Intravenous Q12H  . sodium chloride        Impression: 1. Subdural hematoma, stable. 2. Frequent falls. 3. Hypertension. 4. Dementia. Parkinson's disease. 5. Lumbosacral back pain. 6. Acute on chronic kidney disease.     Plan: 1. Discontinue IV fluids. 2. Disposition-skilled nursing facility when insurance approval. Medically stable for discharge.     LOS: 6 days   Wilson Singer Pager [phone]  05/17/2011, 10:12 AM

## 2011-05-17 NOTE — Progress Notes (Signed)
NAMESHIVAN, HODES                  ACCOUNT NO.:  [id number]  MEDICAL RECORD NO.:  [id number]  LOCATION:  A337                          FACILITY:  [facility]  PHYSICIAN:  Ceil Roderick A. Gerilyn Pilgrim, M.D. DATE OF BIRTH:  13-May-1930  DATE OF PROCEDURE: DATE OF DISCHARGE:                                PROGRESS NOTE   SUBJECTIVE:  He is more responsive today.  He has had started Sinemet and side effects are not reported.  He has done well with his breakfast. Today, he is lot more cooperative.  He still is fixated on his breakfast, which he ate well.  OBJECTIVE:  He does follow commands.  Speech is good.  No hypophonia noted.  Extraocular motions are full.  Facial muscle strength is symmetric.  He has antigravity strength throughout.  He does follow commands bilaterally.  He still continue to have significant rigidity and bradykinesia.  The bradykinesia probably has improved somewhat.  ASSESSMENT AND PLAN:  Significant parkinsonism with baseline dementia. Continue with the Sinemet at the current doses.  We can increase the dose again after about 2 weeks.     Philomina Leon A. Gerilyn Pilgrim, M.D.     KAD/MEDQ  D:  05/17/2011  T:  05/17/2011  Job:  [id number]

## 2011-05-17 NOTE — Progress Notes (Signed)
Subjective: Interval History:   Objective: Vital signs in last 24 hours: Temp:  [97.3 F (36.3 C)-98.3 F (36.8 C)] 98.3 F (36.8 C) (04/30 1000) Pulse Rate:  [69-77] 74  (04/30 1000) Resp:  [20] 20  (04/30 1000) BP: (116-157)/(69-78) 150/71 mmHg (04/30 1000) SpO2:  [93 %-97 %] 97 % (04/30 1000)  Intake/Output from previous day: 04/29 0701 - 04/30 0700 In: 1948 [P.O.:600; I.V.:1348] Out: -  Intake/Output this shift:   Nutritional status: Cardiac    Lab Results:  [facility] 05/17/11 0607 05/16/11 0520  WBC 6.6 7.1  HGB 11.6* 11.4*  HCT 33.6* 32.4*  PLT 137* 142*  NA 139 134*  K 4.0 3.8  CL 106 100  CO2 26 27  GLUCOSE 93 100*  BUN 18 21  CREATININE 1.36* 1.44*  CALCIUM 8.7 8.8  LABA1C -- --   Lipid Panel No results found for this basename: CHOL,TRIG,HDL,CHOLHDL,VLDL,LDLCALC in the last 72 hours  Studies/Results: No results found.  Medications:  Scheduled Meds:   . carbidopa-levodopa  1 tablet Oral BID  . rivastigmine  4.6 mg Transdermal Daily  . sodium chloride  3 mL Intravenous Q12H   Continuous Infusions:   . DISCONTD: sodium chloride 100 mL/hr at 05/17/11 0215   PRN Meds:.   Assessment/Plan: See dictation.    LOS: 6 days   Desirae Mancusi

## 2011-05-17 NOTE — Progress Notes (Signed)
SNF bed selected and awaiting [facility] to get Insurance auth for transfer- hopeful this will be rec'd today-  Reece Levy, MSW, [employer] [phone]

## 2011-05-18 ENCOUNTER — Encounter (HOSPITAL_COMMUNITY): Payer: Self-pay | Admitting: Internal Medicine

## 2011-05-18 DIAGNOSIS — G20 Parkinson's disease: Secondary | ICD-10-CM

## 2011-05-18 DIAGNOSIS — E119 Type 2 diabetes mellitus without complications: Secondary | ICD-10-CM

## 2011-05-18 DIAGNOSIS — F0390 Unspecified dementia without behavioral disturbance: Secondary | ICD-10-CM

## 2011-05-18 DIAGNOSIS — I6200 Nontraumatic subdural hemorrhage, unspecified: Secondary | ICD-10-CM

## 2011-05-18 DIAGNOSIS — G20A1 Parkinson's disease without dyskinesia, without mention of fluctuations: Secondary | ICD-10-CM

## 2011-05-18 MED ORDER — CARBIDOPA-LEVODOPA 25-100 MG PO TABS: 1.0000 | ORAL_TABLET | Freq: Two times a day (BID) | ORAL | Status: DC

## 2011-05-18 MED ORDER — AMLODIPINE BESYLATE 5 MG PO TABS: 5.0000 mg | ORAL_TABLET | Freq: Every day | ORAL | Status: DC

## 2011-05-18 NOTE — Progress Notes (Signed)
Rec'd call from [facility] that [facility] has denied SNF coverage indicating he appears to be at his baseline. Notifying MD and family of above- will ask MD and PT to document anything to support the need for SNF.   Reece Levy, MSW, Theresia Majors [phone]

## 2011-05-18 NOTE — Discharge Summary (Addendum)
Physician Discharge Summary  Patient ID: Blake Howard MRN: [id number] DOB/AGE: Jan 02, 1931 78 y.o.  Admit date: 05/11/2011 Discharge date: 05/18/2011  Primary Care Physician:  No primary provider on file.   Discharge Diagnoses:    Principal Problem:  *Subdural hematoma Active Problems:  Frequent falls  Hypertension  Dementia  Back pain, lumbosacral Acute renal failure, resolved Chronic kidney disease stage 3 Parkinsonism   Medication List  As of 05/18/2011 12:04 PM   STOP taking these medications         lisinopril 10 MG tablet         TAKE these medications         amLODipine 5 MG tablet   Commonly known as: NORVASC   Take 1 tablet (5 mg total) by mouth daily.      CALENDULA Crea   Apply 1 application topically daily as needed. For skin irritation      carbidopa-levodopa 25-100 MG per tablet   Commonly known as: SINEMET IR   Take 1 tablet by mouth 2 (two) times daily.      fish oil-omega-3 fatty acids 1000 MG capsule   Take 1 g by mouth daily.      mulitivitamin with minerals Tabs   Take 1 tablet by mouth daily.      rivastigmine 4.6 mg/24hr   Commonly known as: EXELON   Place 1 patch onto the skin daily.           Discharge Exam: Blood pressure 157/71, pulse 68, temperature 97.8 F (36.6 C), temperature source Oral, resp. rate 20, height 5\' 4"  (1.626 m), weight 64.7 kg (142 lb 10.2 oz), SpO2 94.00%. NAD CTA B S1, S2, RRR Soft, NT, BS+ No edema b/l  Disposition and Follow-up:  Patient is currently awaiting placement at a skilled nursing facility  Consults: telephone consult with neurosurgery, Dr. Franky Macho by Dr. Manus Gunning of ED Neurology, Dr. Gerilyn Pilgrim  Significant Diagnostic Studies:  No results found.  Brief H and P: For complete details please refer to admission H and P, but in brief this is a pleasant 78 year old male who has a history for dementia for the last 2-3 years he does live alone but has a son that is next door and multiple family  members who check up on him daily comes in because of frequent falls. He had a CT of his head which shows a small subdural hematoma. He has no focal neurological deficits. 2 of his daughters are with him right now and they say he is at his baseline. He does have dementia which is at the point of some days he does not recognize his family members but most days he does. He can still ambulate and feed himself and take care of himself at home. He does have a med alert system at home that he wears on his wrist. He has had at least 2 falls that they know of in the last month. Otherwise has been in his normal state of health. We are observing him overnight because of the subdural hematoma. Neurosurgery was called at home and recommended actually that he could go home if there was someone that could stay with him which he lives alone and recommended a repeat CAT scan in the next day or so. Patient denies any pain and has no complaints at this time.     Hospital Course:  This elderly gentleman was admitted to the hospital after sustaining a mechanical fall. He had been having frequent falls recently. On evaluation  in the emergency room CT scan of the head revealed a left-sided subdural hematoma. The case was discussed with neurosurgery by the ER physician. It was felt this was not an acute hematoma and the patient did not require transfer to [facility]. Observation was recommended. Patient was admitted to the hospital and repeat CT scan did not show any acute changes. Patient has mild dementia and was essentially living alone prior to admission. It was not felt safe to discharge patient home without adequate supervision. The family has requested that patient be placed in a skilled nursing facility for short-term rehabilitation.  He was also concerned the patient may be developing early signs of Parkinson's. He was seen in consultation by Dr. Gerilyn Pilgrim for neurology. It was agreed the patient has early  Parkinson's and he was started on Sinemet. The patient has had some improvement with this. He will followup with Dr. Gerilyn Pilgrim in 2 weeks time to further titrate his medications.  Patient was also noted to have acute renal failure. He was given IV fluids and his renal function has since improved back to his baseline. It does appear that he has some chronic kidney disease. His lisinopril has been discontinued. We will add Norvasc to aide in blood pressure control.  Patient is medically stable for transfer to a skilled nursing facility once a bed is available and insurance approval is completed.  Time spent on Discharge:  Signed: Joyann Spidle [facility] Pager: [phone] 05/18/2011, 12:04 PM   Patient has significant physical impairments leading to gait instability and frequent falls.  His most recent fall resulted in a subdural hematoma.  I feel that with the severity of his gait instability and the frequent nature of his falls, patient will likely require and gain the most benefit from skilled nursing facility placement for physical therapy/occupational therapy.

## 2011-05-18 NOTE — Progress Notes (Signed)
I am working diligently with SNF and [facility] Medicare attempting to get authorization for placement ASAP. I have stressed to both SNF and [employer] Rep that the patient has been ready for d/c to SNF since Monday.  Message left for [employer] Rep to return my call. Reece Levy, MSW, Theresia Majors [phone]

## 2011-05-18 NOTE — Progress Notes (Signed)
Physical Therapy Treatment Patient Details Name: Blake Howard MRN: [id number] DOB: September 03, 1930 Today's Date: 05/18/2011 Time: 1478-2956 PT Time Calculation (min): 14 min  PT Assessment / Plan / Recommendation Comments on Treatment Session  Attempted several exercises with patient however he is not willing/able to follow through on exercises commands and only wants ambulate or lie down. Upon entering room patient wanted to stand immediately. Patient needed multimodal cueing to be seated. Patient completed 26' of gait training with RW Min A for asistance with guiding RW. Patient will speak only very few words    Follow Up Recommendations       Equipment Recommendations       Frequency     Plan      Precautions / Restrictions     Pertinent Vitals/Pain     Mobility  Bed Mobility Supine to Sit: 6: Modified independent (Device/Increase time) Sitting - Scoot to Edge of Bed: 6: Modified independent (Device/Increase time) Sit to Supine: 6: Modified independent (Device/Increase time) Transfers Sit to Stand: 4: Min guard Stand to Sit: 4: Min guard Details for Transfer Assistance: verbal cues for hand placements Ambulation/Gait Ambulation/Gait Assistance: 4: Min assist Ambulation Distance (Feet): 95 Feet Assistive device: Rolling walker Ambulation/Gait Assistance Details: assistance with guiding RW Gait Pattern: Step-to pattern;Decreased step length - right;Decreased step length - left;Trunk flexed;Decreased dorsiflexion - left;Decreased dorsiflexion - right;Narrow base of support;Decreased trunk rotation (decreased weight shift left and right) Gait velocity: slow    Exercises Other Exercises Other Exercises: bridges x3   PT Goals    Visit Information  Last PT Received On: 05/18/11    Subjective Data      Cognition       Balance     End of Session PT - End of Session Equipment Utilized During Treatment: Gait belt Activity Tolerance:  (Patient not able to follow through on  exercise commands ) Patient left: in bed;with call bell/phone within reach;with bed alarm set    Airis Barbee ATKINSO 05/18/2011, 2:05 PM

## 2011-05-19 DIAGNOSIS — G20 Parkinson's disease: Secondary | ICD-10-CM

## 2011-05-19 DIAGNOSIS — F0390 Unspecified dementia without behavioral disturbance: Secondary | ICD-10-CM

## 2011-05-19 DIAGNOSIS — E119 Type 2 diabetes mellitus without complications: Secondary | ICD-10-CM

## 2011-05-19 DIAGNOSIS — I6200 Nontraumatic subdural hemorrhage, unspecified: Secondary | ICD-10-CM

## 2011-05-19 MED ORDER — CARBIDOPA-LEVODOPA 25-100 MG PO TABS: 1.0000 | ORAL_TABLET | Freq: Two times a day (BID) | ORAL | Status: AC

## 2011-05-19 MED ORDER — AMLODIPINE BESYLATE 5 MG PO TABS: 5.0000 mg | ORAL_TABLET | Freq: Every day | ORAL | Status: AC

## 2011-05-19 NOTE — Discharge Instructions (Signed)
Subdural Hematoma A subdural hematoma is a collection of blood between the brain and its tough outer covering membrane (the dura). This is caused by bleeding (hemorrhage) from a ruptured blood vessel. There are two types of subdural hematomas.  Acute subdural hemorrhage. This is subdural bleeding that develops shortly after a serious blow to the head. Blood collects very fast in this injury and may cause the pressure to rise within the brain. If not diagnosed and treated promptly, severe brain injury or death can occur.   Chronic subdural hemorrhage. This is when bleeding develops slowly, over weeks to months.  The brain takes up all the space in the skull, so there is no room for blood clots. The clots will push down on the brain and the bigger the clot, the more it pushes on the brain. The brain gets very irritated when touched by blood and can stop working. Moreover, depending on the part of the brain that stops working, that is the function the patient will lose. It can compress the brain and eventually cause death. CAUSES  Some kind of a trauma causing an injury to the head:   Motor vehicle-related accidents.   Falls in the elderly (more than 76 years old).  SYMPTOMS  The length of time it takes symptoms to develop and improve varies:  An acute subdural hemorrhage develops over minutes to hours. Symptoms can include:   Temporary loss of consciousness (concussion).   Weakness of arms or legs on one side of the body.   Changes in vision or speech.   Severe headache.   Seizures.   Nausea and vomiting.   Increased sleepiness.   A chronic subdural hemorrhage develops over weeks to months. Symptoms may develop slowly and produce less noticeable problems or changes. These patients will have many small bleeds that over a period of weeks to months, add up to become a big enough clot to start having symptoms, which may include:   Mild headache.   Change in personality.   Loss of  balance or difficulty walking.   Weakness, numbness, or tingling in the arms or legs.   Nausea or vomiting.   Memory loss.   Double vision.   Increased sleepiness.  DIAGNOSIS  All head injuries should be checked out quickly by a caregiver. This is especially true if there has been any loss of consciousness. If you are able, the caregiver will usually ask a list of questions. Your caregiver will perform a thorough physical and neurological exam. If the caregiver suspects there is bleeding within the head, he/she will order a CT scan. Although it is very easy to see blood on a CT scan, this is not a good way to see damage to the brain itself. If there is blood on the scan, its color will help the caregiver figure out how old it is. Utilizing the information from the scan, the history and the physical exam, the caregiver can figure out why this person is not feeling well. TREATMENT   Acute subdural hemorrhage:   Requires medical attention right away! In many cases, emergency surgery must be performed. The blood clot must be removed. The purpose of having an operation and having the clot removed is to make room and let the brain rest comfortably.   You may be placed in an intensive care unit (ICU) where a nurse can watch you very closely and look for any changes in your function or behavior. Careful attention will be paid to your breathing, blood   pressure and neurological function.   Sometimes, medications or controlled breathing through a ventilator is needed may be needed to decrease the pressure in the brain. This is especially true if there is any swelling of the brain itself.   If the patient is not awake, they will need a monitor placed in their head that will tell the doctors and nurses how much pressure there is in the brain. A sudden increase in the pressure may mean that the bleeding has started again.   Chronic subdural hemorrhages:   May require emergency treatment. Most physicians  will recommend surgery for larger hemorrhages and those that cause neurological symptoms.   Others do not require treatment at all. Simple treatment with bed rest, medications and observation may be reasonable for smaller hematomas that cause minimal or no symptoms.   People who develop a subdural hemorrhage are at risk of developing seizures, even after the hematoma has been treated. To prevent seizures, some caregivers will prescribe anti-seizure (anticonvulsant) medications for a year or longer.  PREVENTION  Accidents, including head injuries, are the leading cause of death in young people. Many others could be prevented with simple precautions or safety equipment. To help prevent head injuries:  Avoid drinking and driving or doing drugs and driving.   Practice job safety, especially if your job involves working high above ground.   Have your vision checked regularly. Poor vision can increase your risk of falls and other accidents.   Clear your home or apartment of hazards. For example, throw rugs and extension cords can cause you to trip and fall. If you feel unsteady on your feet, consider using a cane or walker.   If you play a contact sport such as football, hockey or soccer and you experience a significant head injury, allow enough time for healing before you start playing again (up to 15 days). A repeated injury that occurs during this fragile repair period is likely to result in hemorrhage. This is called the second impact syndrome.   If you take blood thinners (warfarin [Coumadin], aspirin and other anti-inflammatories), make sure that you have close medical supervision with regular monitoring. If you are on any blood thinners then even a very small fall or trauma can cause a subdural bleed. You should not hesitate to seek medical attention regardless of how minor you think your symptoms are.   Wear a helmet when bicycling or snow-boarding.  SEEK IMMEDIATE MEDICAL CARE IF:  You  experience a head injury with any of the following symptoms:   Drowsiness or a decrease in alertness.   Confusion or forgetfulness.   Slurred speech.   Irrational or aggressive behavior.   Numbness or paralysis in any part of the body.   Has a bleeding disorder.   Feel sick to your stomach (nauseous) or throw up (vomit).   Difficulty walking or poor coordination.   Double vision.   Seizures.   Has a history of heavy alcohol use.   Takes medications to thin the blood.  FOR MORE INFORMATION National Institute of Neurological Disorders and Stroke: www.ninds.nih.gov American Association of Neurological Surgeons: www.neurosurgerytoday.org American Academy of Neurology (AAN): www.aan.com Brain Injury Association of America: www.biausa.org Document Released: 11/21/2003 Document Revised: 12/23/2010 Document Reviewed: 08/08/2008 Exit[employer] Patient Information 2012 [employer]. 

## 2011-05-19 NOTE — Progress Notes (Signed)
Pt discharged home with home health per Dr. Kerry Hough. Pt's VS stable at this time. Pt's IV site D/C'd and WNL. Pt's daughter provided with home medication list and discharge instructions. Verbalized understanding. Pt left floor in stable condition accompanied by NT via WC.

## 2011-05-19 NOTE — Progress Notes (Signed)
Patient's Insurance provider has declined SNF approval- stating he appears to be at his baseline. Discussed during LOS meeting and I have followed up with daughter, Blake Howard, who is prepared to take him home to her house. Discussed HH and DME needs with her and RNCM is arranging per MD orders. Daughter has a positive attitude regarding change in plans and states the family is in and out of her home all day and she will have good support. Answered all her questions - updated MD. Reece Levy, MSW, LCSWA [phone]

## 2011-05-19 NOTE — Progress Notes (Signed)
Subjective: No changes overnight, patient is sleeping on my arrival  Objective: Vital signs in last 24 hours: Temp:  [98.1 F (36.7 C)-98.6 F (37 C)] 98.2 F (36.8 C) (05/02 1035) Pulse Rate:  [64-74] 66  (05/02 1035) Resp:  [20] 20  (05/02 1035) BP: (120-138)/(67-80) 138/80 mmHg (05/02 1035) SpO2:  [93 %-97 %] 95 % (05/02 1035) Weight change:  Last BM Date: 05/18/11  Intake/Output from previous day: 05/01 0701 - 05/02 0700 In: 240 [P.O.:240] Out: -      Physical Exam: Unchanged from 05/18/11    Lab Results: Basic Metabolic Panel:  Basename 05/17/11 0607  NA 139  K 4.0  CL 106  CO2 26  GLUCOSE 93  BUN 18  CREATININE 1.36*  CALCIUM 8.7  MG --  PHOS --   Liver Function Tests:  Basename 05/17/11 0607  AST 13  ALT <5  ALKPHOS 60  BILITOT 0.2*  PROT 5.6*  ALBUMIN 2.6*   No results found for this basename: LIPASE:2,AMYLASE:2 in the last 72 hours No results found for this basename: AMMONIA:2 in the last 72 hours CBC:  Basename 05/17/11 0607  WBC 6.6  NEUTROABS --  HGB 11.6*  HCT 33.6*  MCV 89.6  PLT 137*   Cardiac Enzymes: No results found for this basename: CKTOTAL:3,CKMB:3,CKMBINDEX:3,TROPONINI:3 in the last 72 hours BNP: No results found for this basename: PROBNP:3 in the last 72 hours D-Dimer: No results found for this basename: DDIMER:2 in the last 72 hours CBG: No results found for this basename: GLUCAP:6 in the last 72 hours Hemoglobin A1C: No results found for this basename: HGBA1C in the last 72 hours Fasting Lipid Panel: No results found for this basename: CHOL,HDL,LDLCALC,TRIG,CHOLHDL,LDLDIRECT in the last 72 hours Thyroid Function Tests: No results found for this basename: TSH,T4TOTAL,FREET4,T3FREE,THYROIDAB in the last 72 hours Anemia Panel: No results found for this basename: VITAMINB12,FOLATE,FERRITIN,TIBC,IRON,RETICCTPCT in the last 72 hours Coagulation: No results found for this basename: LABPROT:2,INR:2 in the last 72  hours Urine Drug Screen: Drugs of Abuse  No results found for this basename: labopia, cocainscrnur, labbenz, amphetmu, thcu, labbarb    Alcohol Level: No results found for this basename: ETH:2 in the last 72 hours Urinalysis: No results found for this basename: COLORURINE:2,APPERANCEUR:2,LABSPEC:2,PHURINE:2,GLUCOSEU:2,HGBUR:2,BILIRUBINUR:2,KETONESUR:2,PROTEINUR:2,UROBILINOGEN:2,NITRITE:2,LEUKOCYTESUR:2 in the last 72 hours  No results found for this or any previous visit (from the past 240 hour(s)).  Studies/Results: No results found.  Medications: Scheduled Meds:   . carbidopa-levodopa  1 tablet Oral BID  . rivastigmine  4.6 mg Transdermal Daily  . sodium chloride  3 mL Intravenous Q12H   Continuous Infusions:  PRN Meds:.  Assessment/Plan:  Principal Problem:  *Subdural hematoma Active Problems:  Frequent falls  Hypertension  Dementia  Back pain, lumbosacral  Plan:  Unfortunately patient will not be discharged to a skilled nursing facility, since his insurance claim has been denied.  His daughter is agreeable to take him home with home health services.  These will be arranged prior to discharged.  The remainder of the plan is unchanged from discharge summary done on 05/18/11   LOS: 8 days   Esgar Barnick [facility] Pager: [phone] 05/19/2011, 10:54 AM

## 2011-05-19 NOTE — Care Management Note (Signed)
    Page 1 of 1   05/19/2011     1:18:22 PM   CARE MANAGEMENT NOTE 05/19/2011  Patient:  Blake Howard, Blake Howard   Account Number:  [id number]  Date Initiated:  05/12/2011  Documentation initiated by:  Rosemary Holms  Subjective/Objective Assessment:   Pt admitted after multiple falls. PTA lived alone with son living beside him and other family checking in on him when they can. Baseline prior to this sickness was up and walking.     Action/Plan:   PT evaluation is concerning for return imediately to home. May need CSW referral for SNF referral but given Insurance, needs to be made ASAP. If HHPT, family [facility]   Anticipated DC Date:  05/17/2011   Anticipated DC Plan:  HOME W HOME HEALTH SERVICES  In-house referral  Clinical Social Worker      DC Planning Services  CM consult      Choice offered to / List presented to:          [facility]  HH-1 RN  HH-10 DISEASE MANAGEMENT  HH-2 PT  HH-4 NURSE'S AIDE  HH-6 SOCIAL WORKER      HH agency  [facility]   Status of service:  Completed, signed off Medicare Important Message given?  YES (If response is "NO", the following Medicare IM given date fields will be blank) Date Medicare IM given:  05/19/2011 Date Additional Medicare IM given:    Discharge Disposition:  HOME W HOME HEALTH SERVICES  Per UR Regulation:    If discussed at Long Length of Stay Meetings, dates discussed:   05/19/2011    Comments:  05/19/11 1200 Shariya Gaster Leanord Hawking RN BSN CM [employer] Medicare denied approval for SNF. Arrangements made for [facility] RN,PT,Aide and CSW. No DME needed. [facility] spoke to daugher who is taking pt to her [address] [place]. [phone]  05/16/11 1500 Gaetana Kawahara RN BSN CM Bed available for SNF.  05/12/11 1400 Nitika Jackowski Leanord Hawking RN BSN CM

## 2013-01-17 DEATH — deceased

## 2013-01-18 ENCOUNTER — Telehealth: Payer: Self-pay

## 2013-01-18 NOTE — Telephone Encounter (Signed)
Patient past away per Obituary in [place] [employer] °

## 2013-10-21 IMAGING — CT CT HEAD W/O CM
1 series · 16 of 30 positions shown, 20 images · non-contrast
Comparison: MRI 03/06/2009, head CT 03/06/2009

CLINICAL DATA: Fall

CT HEAD WITHOUT CONTRAST
TECHNIQUE: Contiguous axial images were obtained from the base of
the skull through the vertex without contrast.

[Series 2: headtrauma 4.8 h37s · axial · 0.43mm/px · z∈[+70,+203]mm · 16 of 30 slices shown, 20 images]
[im 2/30  brain]
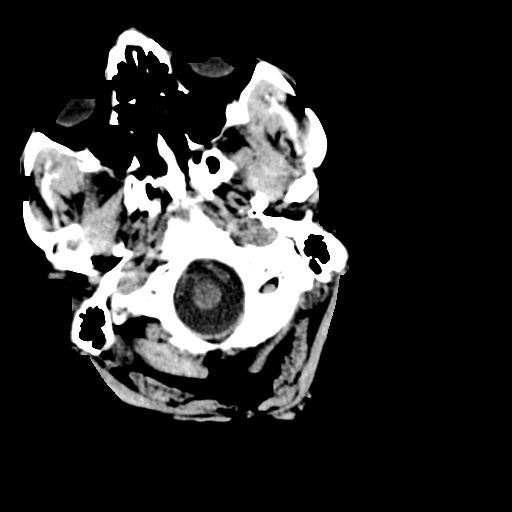
[im 2/30  bone]
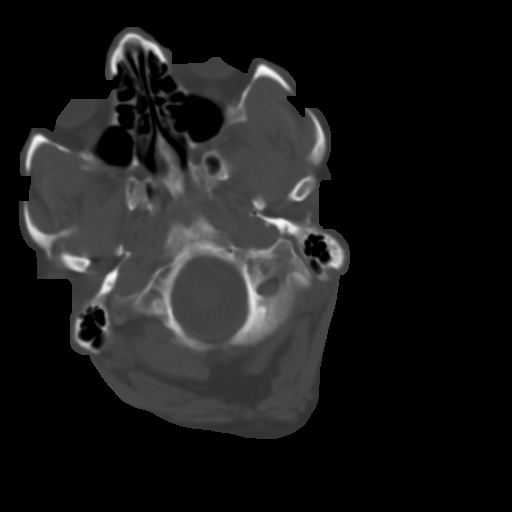
[im 4/30  brain]
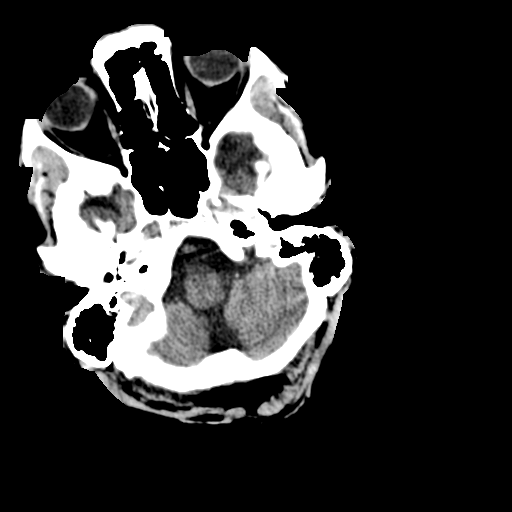
[im 6/30  brain]
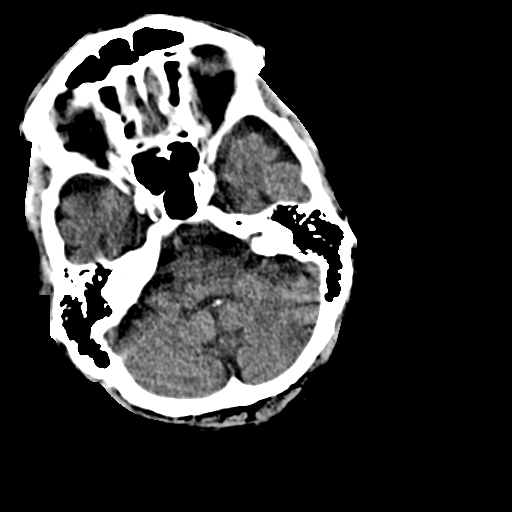
[im 8/30  brain]
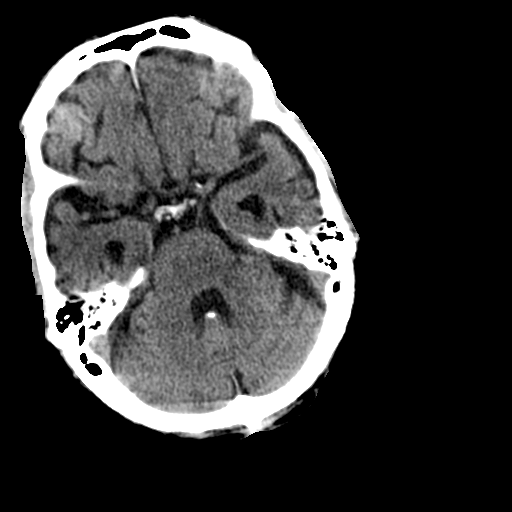
[im 9/30  brain]
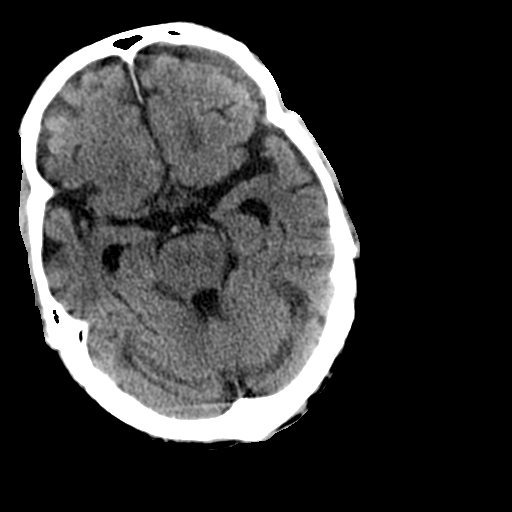
[im 9/30  bone]
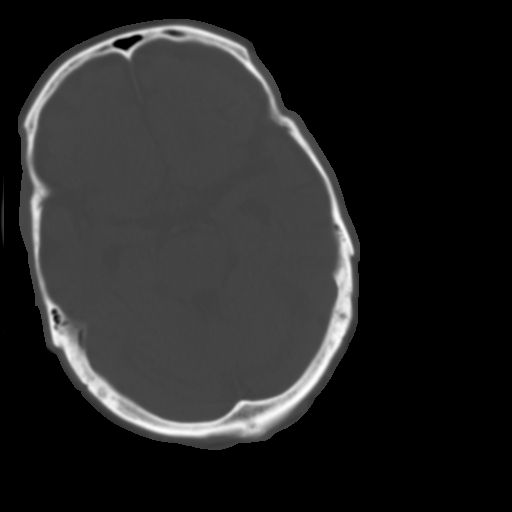
[im 11/30  brain]
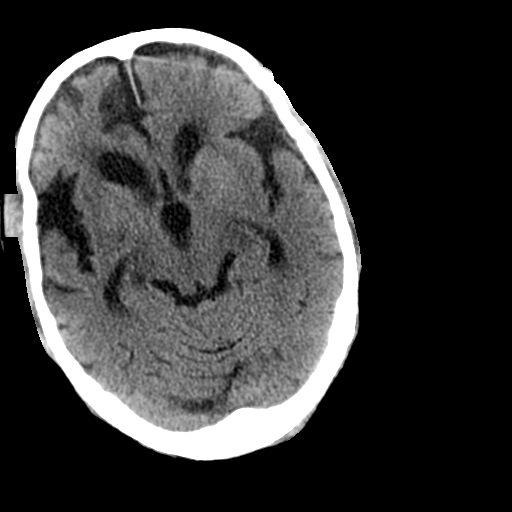
[im 13/30  brain]
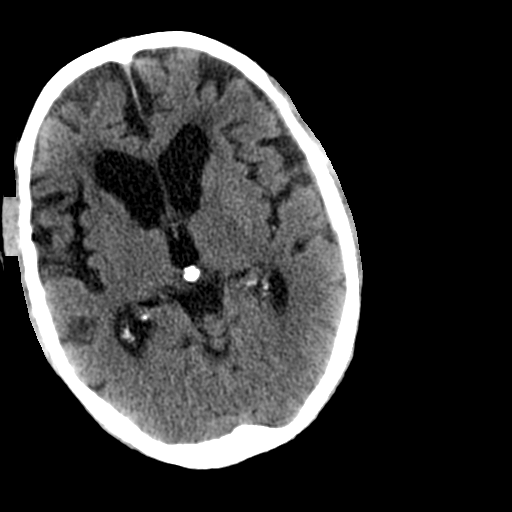
[im 15/30  brain]
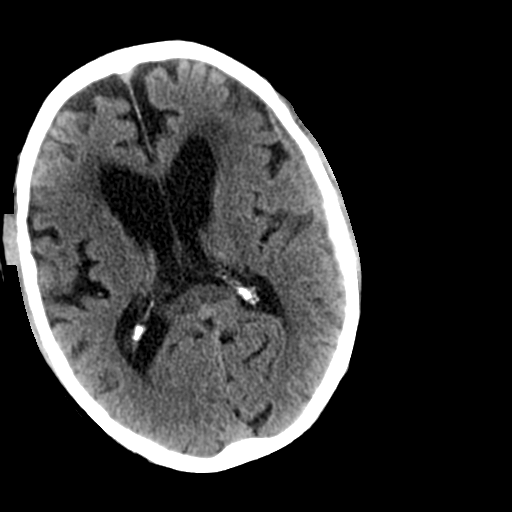
[im 16/30  brain]
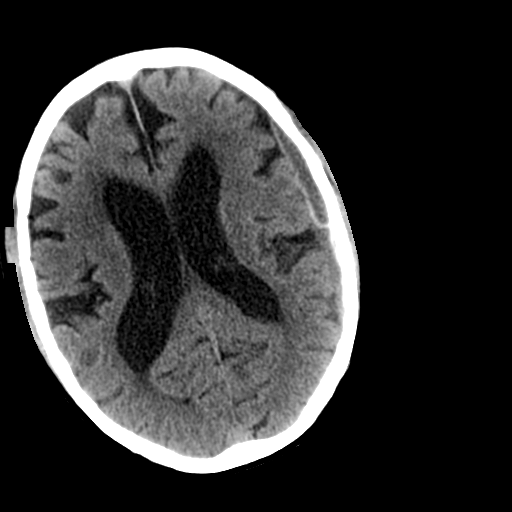
[im 16/30  bone]
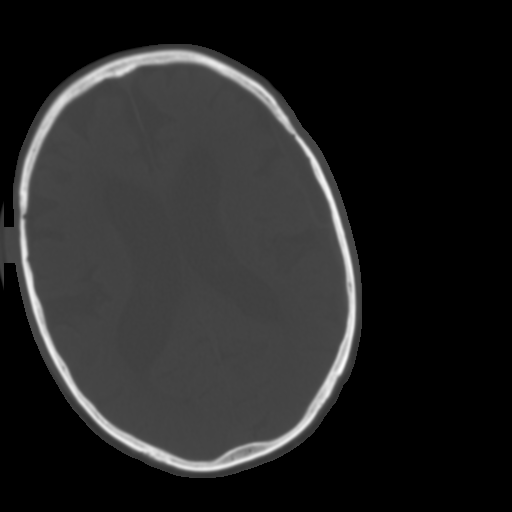
[im 18/30  brain]
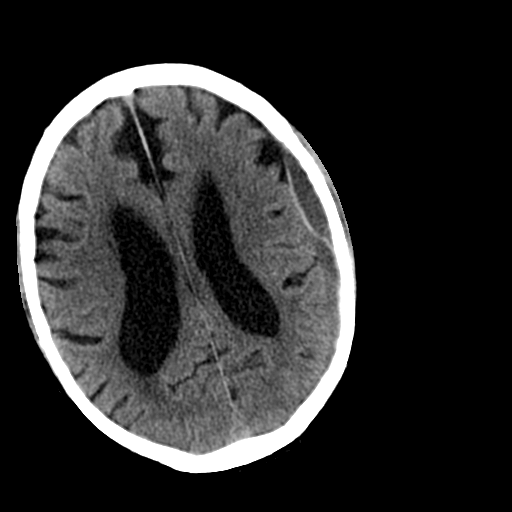
[im 20/30  brain]
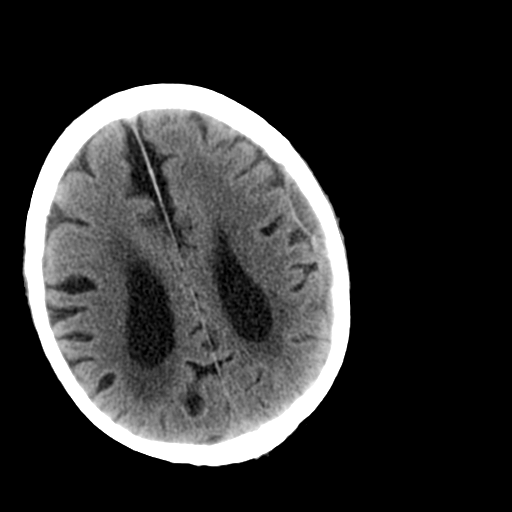
[im 22/30  brain]
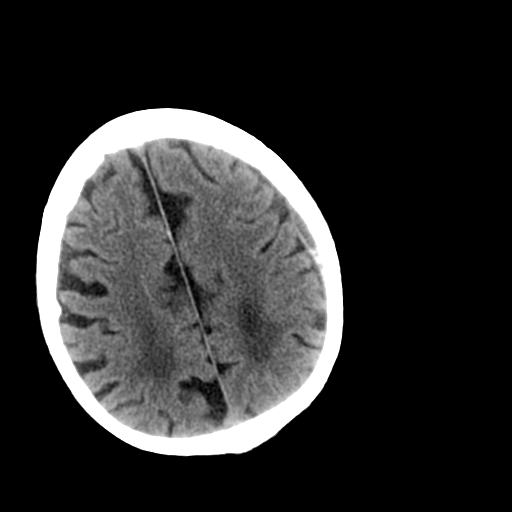
[im 23/30  brain]
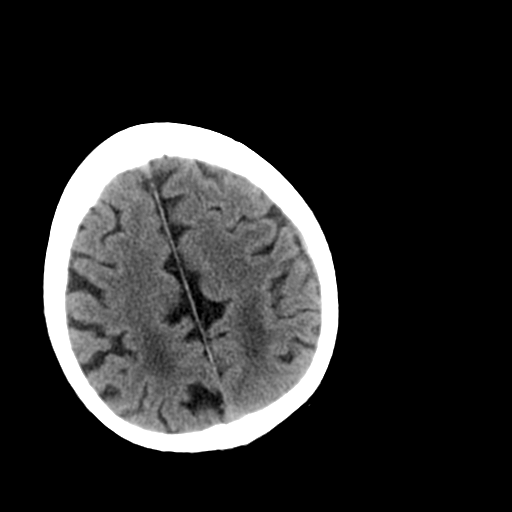
[im 23/30  bone]
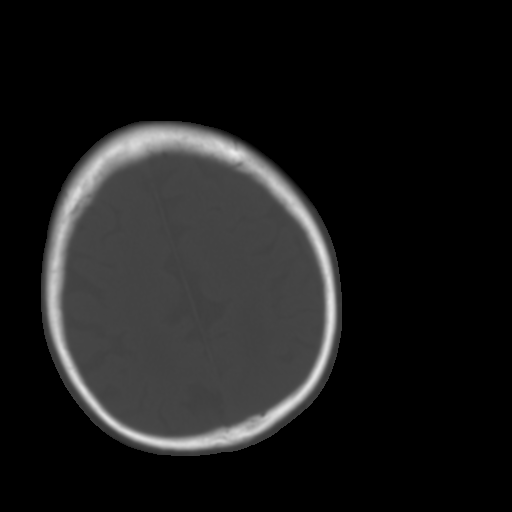
[im 25/30  brain]
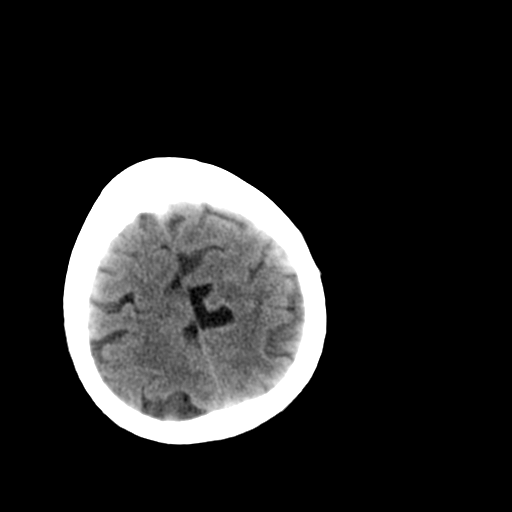
[im 27/30  brain]
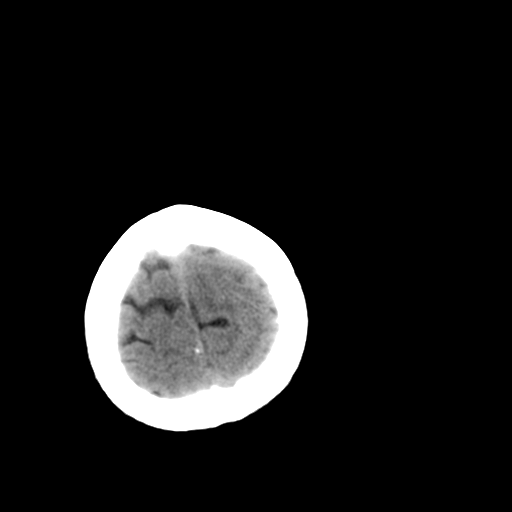
[im 29/30  brain]
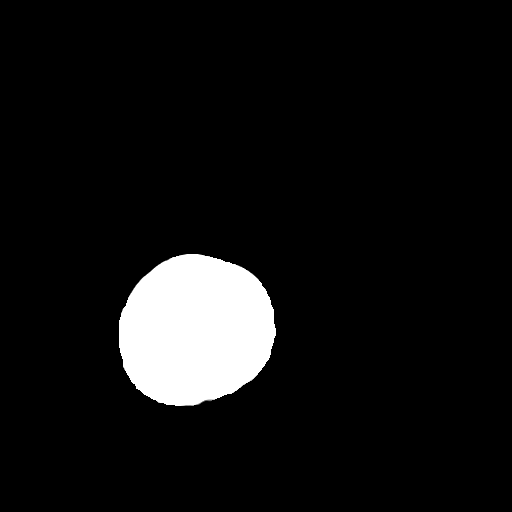

[16 of 30 positions shown; findings below may reference images not displayed]

FINDINGS: Right basal ganglial lacunar infarct again noted.
Cortical volume loss noted with proportional ventricular
prominence.  Periventricular white matter hypodensity likely
indicates small vessel ischemic change.  There is a mixed density
lenticular left extra-axial fluid collection.  No overlying skull
fracture.  No midline shift.  Left and paranasal sinuses are
intact.  No acute infarct or mass lesion identified.
IMPRESSION: Mixed density left subdural hematoma most compatible with mixed
acute versus subacute/chronic evolution.

Critical Value/emergent results were called by telephone at the
time of interpretation on 05/11/2011  at [DATE] p.m.  to  Dr.
Radzi, who verbally acknowledged these results.

## 2013-10-22 IMAGING — CR DG THORACIC SPINE 2V
3 series · 3 of 3 positions shown · non-contrast
Comparison: Chest radiographs 05/11/2011 and earlier.

CLINICAL DATA: 81-year-old male with pain.

THORACIC SPINE - 2 VIEW

[view not recorded (1 of 3)]
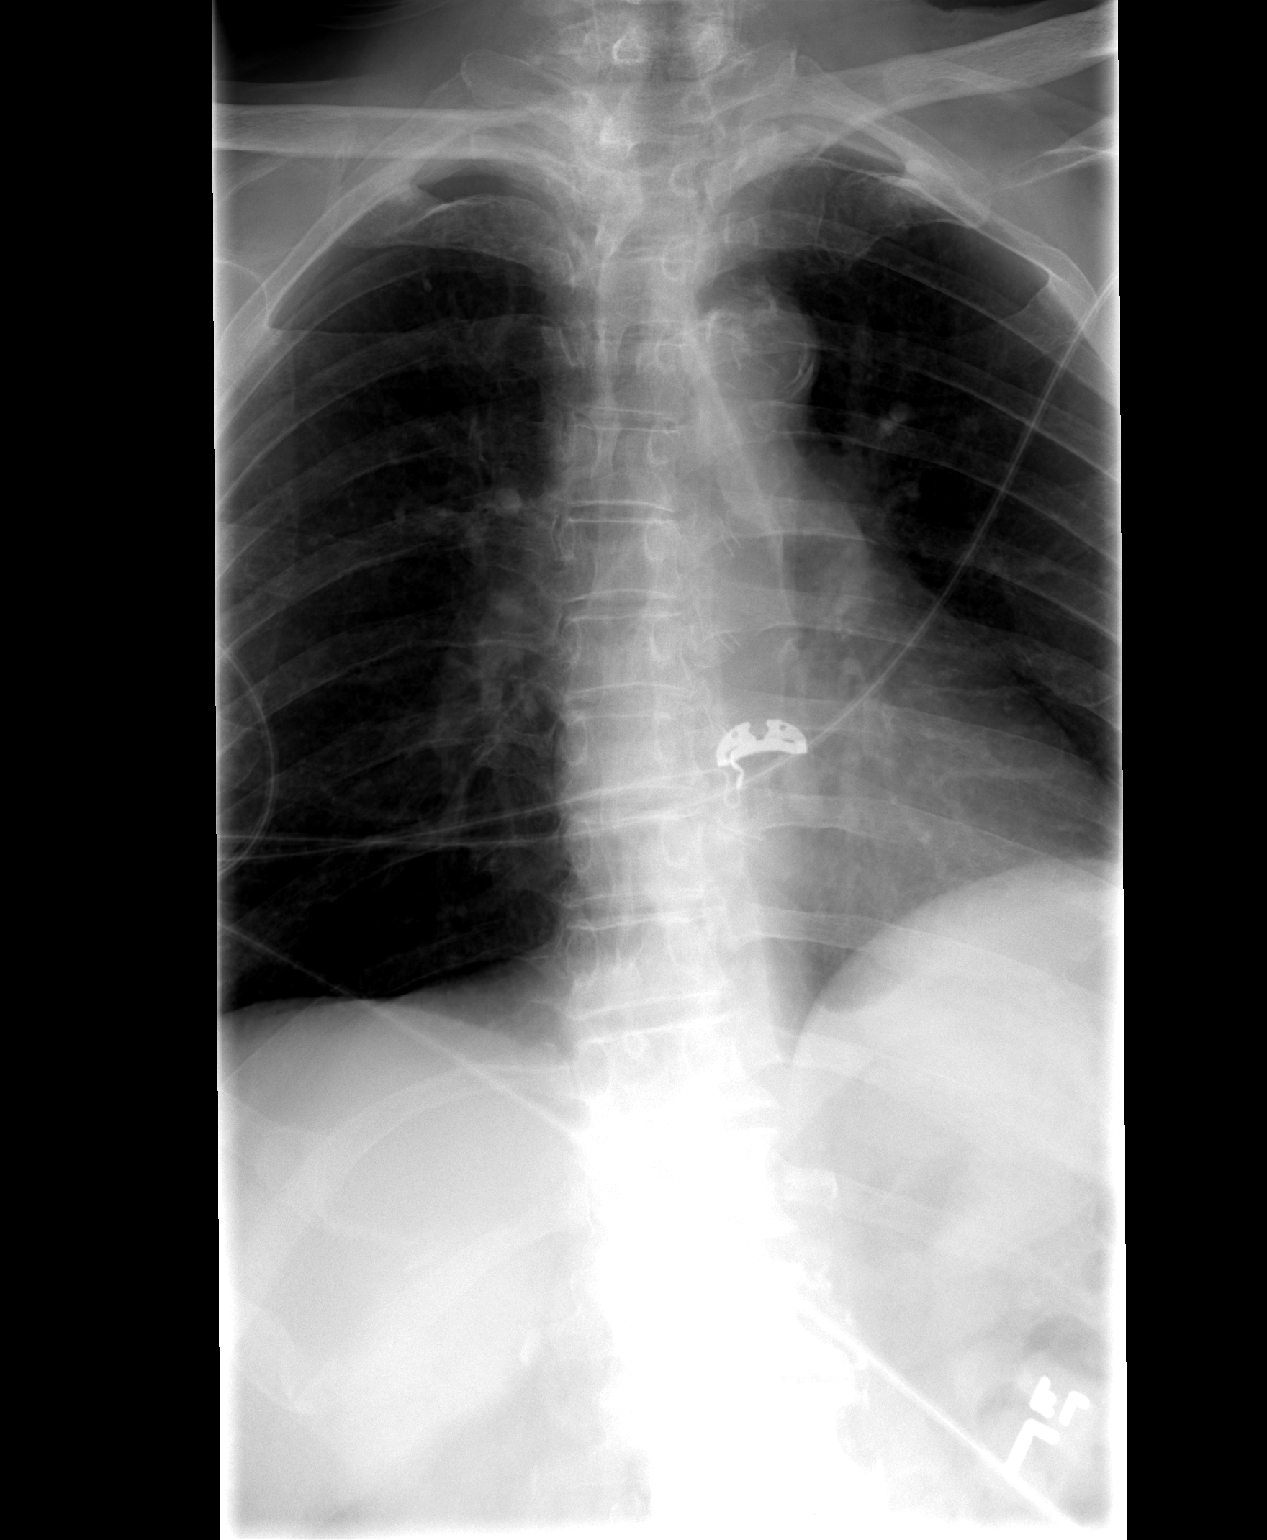

[view not recorded (2 of 3)]
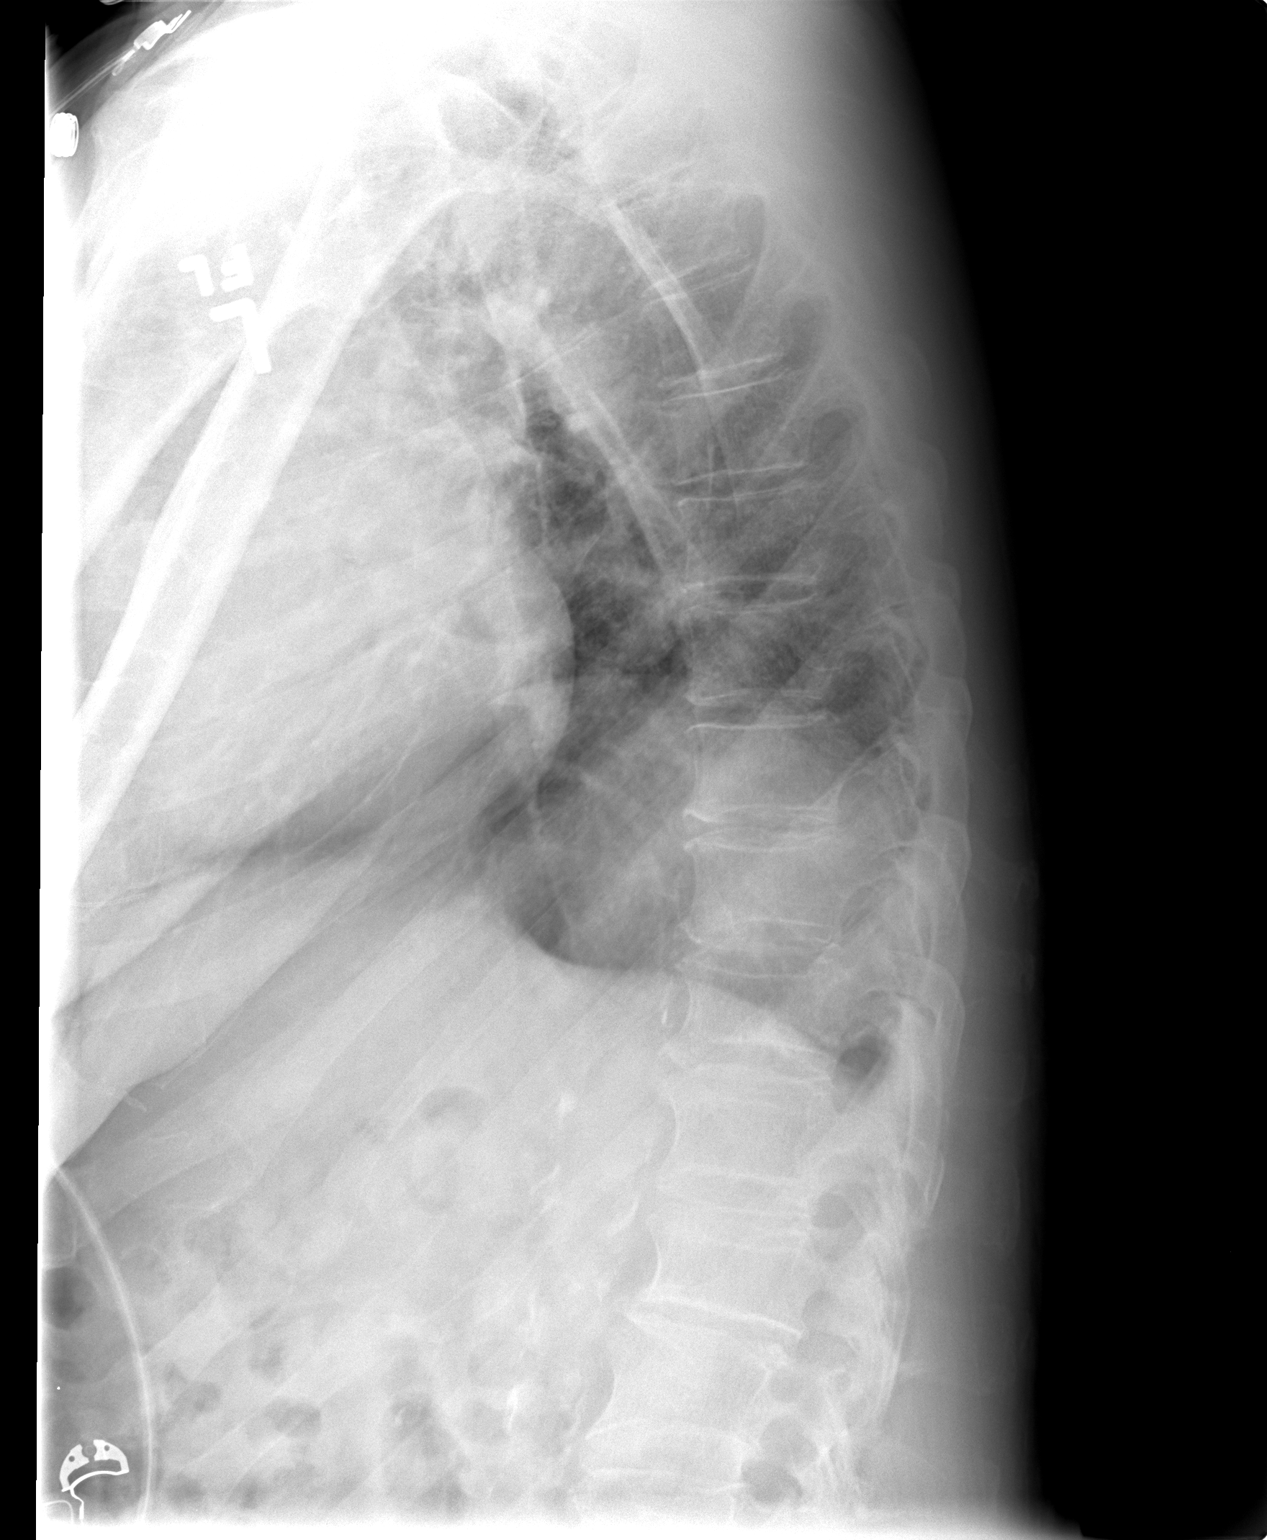

[view not recorded (3 of 3)]
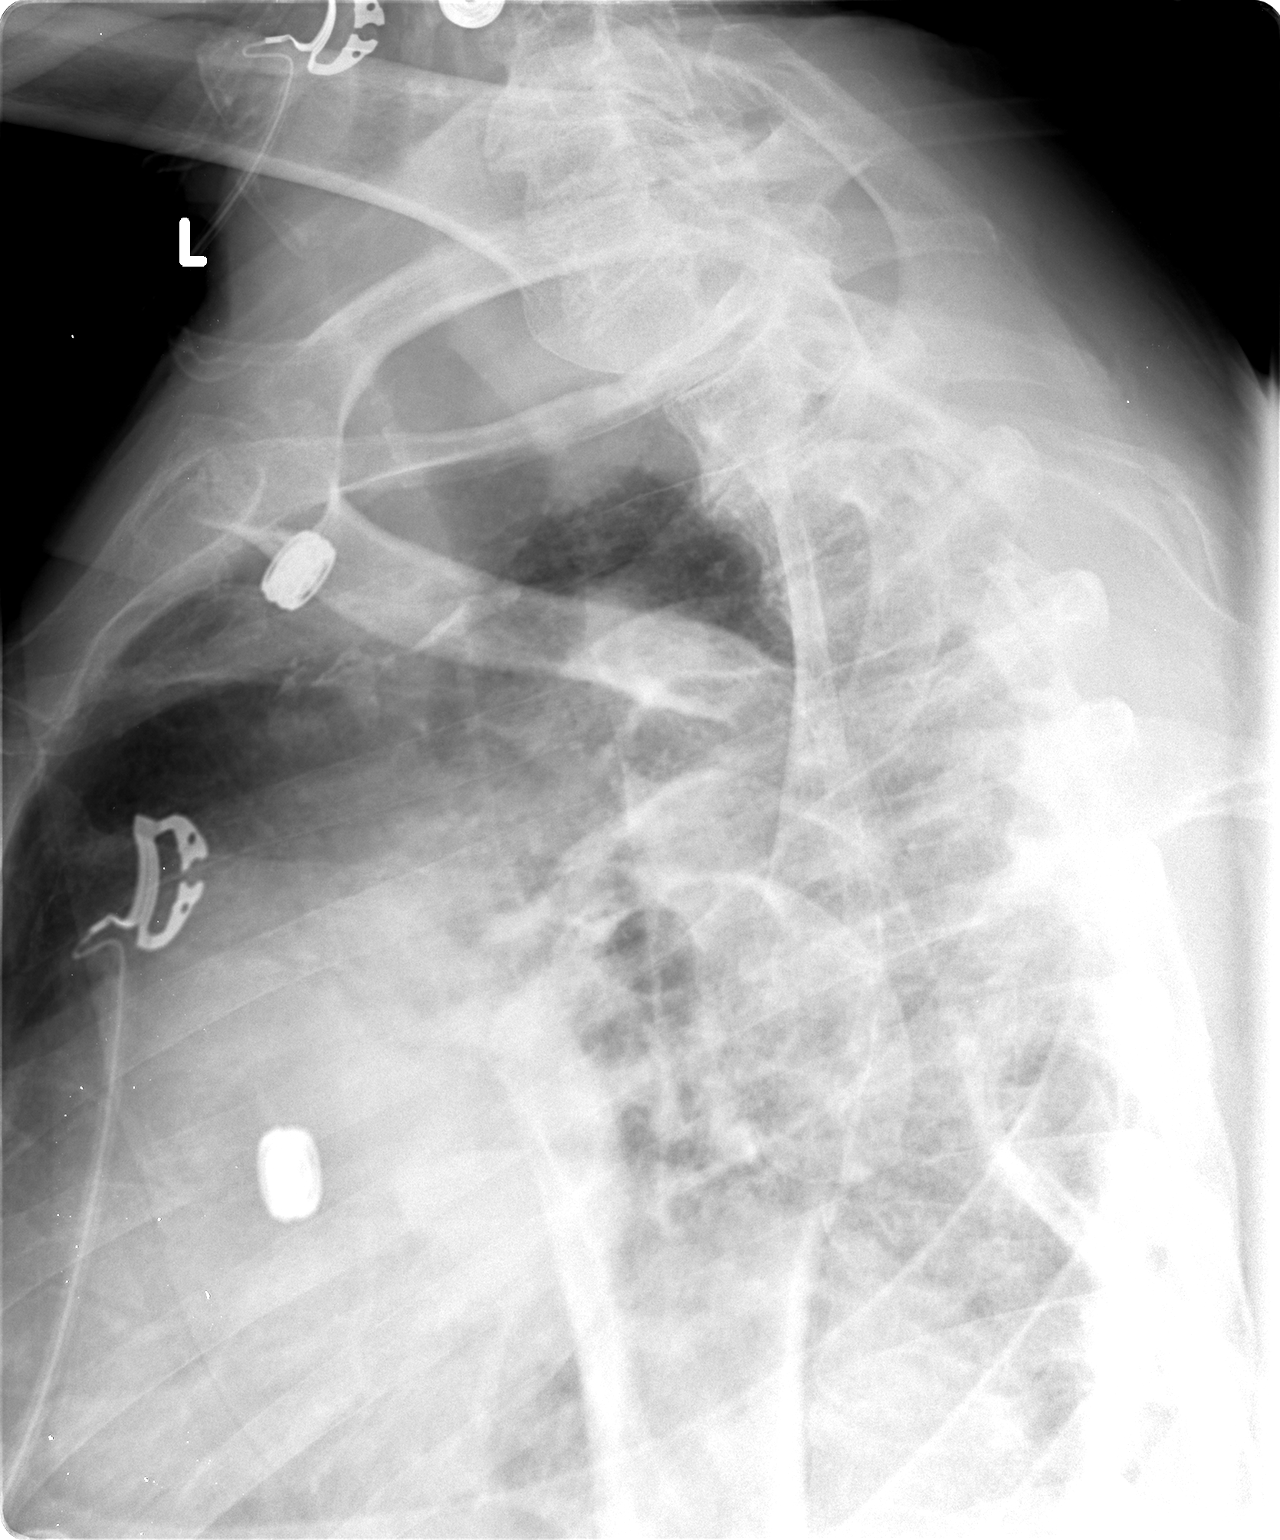

[3 of 3 positions shown; findings below may reference images not displayed]

FINDINGS: Normal thoracic segmentation. Cervicothoracic junction
alignment is within normal limits.  Thoracic vertebral bodies
appears stable and intact.  Relatively preserved disc spaces.
Grossly stable visualized thoracic visceral contours.  Calcified
atherosclerosis of the aorta.
IMPRESSION: No acute fracture or listhesis identified in the thoracic spine.
